# Patient Record
Sex: Male | Born: 2000 | Race: White | Hispanic: No | Marital: Single | Smoking: Current every day smoker
Health system: Southern US, Community
[De-identification: ages and names within clinical notes are randomized; demographics above are authoritative.]

## PROBLEM LIST (undated history)

## (undated) DIAGNOSIS — F329 Major depressive disorder, single episode, unspecified: Secondary | ICD-10-CM

## (undated) DIAGNOSIS — T7840XA Allergy, unspecified, initial encounter: Secondary | ICD-10-CM

## (undated) DIAGNOSIS — E785 Hyperlipidemia, unspecified: Secondary | ICD-10-CM

## (undated) DIAGNOSIS — J45909 Unspecified asthma, uncomplicated: Secondary | ICD-10-CM

## (undated) DIAGNOSIS — Z22322 Carrier or suspected carrier of Methicillin resistant Staphylococcus aureus: Secondary | ICD-10-CM

## (undated) DIAGNOSIS — Z7289 Other problems related to lifestyle: Secondary | ICD-10-CM

## (undated) DIAGNOSIS — I10 Essential (primary) hypertension: Secondary | ICD-10-CM

## (undated) DIAGNOSIS — K219 Gastro-esophageal reflux disease without esophagitis: Secondary | ICD-10-CM

## (undated) DIAGNOSIS — F411 Generalized anxiety disorder: Secondary | ICD-10-CM

## (undated) HISTORY — DX: Hyperlipidemia, unspecified: E78.5

## (undated) HISTORY — PX: TONSILLECTOMY: SUR1361

## (undated) HISTORY — DX: Gastro-esophageal reflux disease without esophagitis: K21.9

## (undated) HISTORY — DX: Essential (primary) hypertension: I10

## (undated) HISTORY — DX: Allergy, unspecified, initial encounter: T78.40XA

## (undated) HISTORY — DX: Major depressive disorder, single episode, unspecified: F32.9

## (undated) HISTORY — DX: Unspecified asthma, uncomplicated: J45.909

## (undated) HISTORY — DX: Generalized anxiety disorder: F41.1

## (undated) HISTORY — DX: Carrier or suspected carrier of methicillin resistant Staphylococcus aureus: Z22.322

---
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
## 2020-01-07 ENCOUNTER — Encounter: Payer: Self-pay | Admitting: Emergency Medicine

## 2020-01-07 ENCOUNTER — Emergency Department
Admission: EM | Admit: 2020-01-07 | Discharge: 2020-01-07 | Disposition: A | Discharge status: Home / Self Care | Payer: BC Managed Care – PPO | Source: Home / Self Care

## 2020-01-07 ENCOUNTER — Other Ambulatory Visit: Payer: Self-pay

## 2020-01-07 DIAGNOSIS — S39012A Strain of muscle, fascia and tendon of lower back, initial encounter: Secondary | ICD-10-CM | POA: Diagnosis not present

## 2020-01-07 DIAGNOSIS — M62830 Muscle spasm of back: Secondary | ICD-10-CM | POA: Diagnosis not present

## 2020-01-07 MED ORDER — METHOCARBAMOL 500 MG PO TABS: 500.0000 mg | ORAL_TABLET | Freq: Two times a day (BID) | ORAL | 0 refills | Status: DC

## 2020-01-07 MED ORDER — PREDNISONE 10 MG PO TABS: 40.0000 mg | ORAL_TABLET | Freq: Every day | ORAL | 0 refills | Status: AC

## 2020-01-07 NOTE — ED Provider Notes (Signed)
[name]    CSN: [id number] Arrival date & time: 01/07/20  1619      History   Chief Complaint Chief Complaint  Patient presents with  . Back Pain    HPI Kyle Fitzpatrick is a 19 y.o. male.   19 year old male, with history of asthma, dyslipidemia, hypertension, GERD, generalized anxiety, presenting today complaining of back pain.  Patient is complaining of left-sided lumbar back pain/muscle spasms over the past 3 days.  States that he does heavy lifting at work and has had pain since that time.  No midline pain.  States the pain radiates into his left posterior thigh.  No loss of bowel or bladder function, saddle anesthesia, numbness or weakness in lower extremities.  States that he has been taking 800 mg ibuprofen twice daily without much improvement.  The history is provided by the patient.  Back Pain Location:  Lumbar spine Quality:  Aching Radiates to:  L posterior upper leg Pain severity:  Moderate Pain is:  Same all the time Onset quality:  Gradual Duration:  3 days Timing:  Constant Progression:  Unchanged Chronicity:  New Context: lifting heavy objects and occupational injury   Context: not emotional stress, not falling, not jumping from heights, not MCA, not MVA, not pedestrian accident, not physical stress, not recent illness, not recent injury and not twisting   Relieved by:  Nothing Worsened by:  Bending and twisting Ineffective treatments:  NSAIDs Associated symptoms: leg pain   Associated symptoms: no abdominal pain, no abdominal swelling, no bladder incontinence, no bowel incontinence, no chest pain, no dysuria, no fever, no headaches, no numbness, no paresthesias, no pelvic pain, no perianal numbness, no tingling, no weakness and no weight loss   Risk factors: no hx of cancer, no hx of osteoporosis, no lack of exercise, no menopause, not obese, not pregnant, no recent surgery, no steroid use and no vascular disease     Past Medical History:    Diagnosis Date  . Allergy   . Asthma    no hospitalizations; no ED visits.    . Deliberate self-cutting   . Dyslipidemia 02/16/2018  . Essential hypertension 02/15/2018  . GAD (generalized anxiety disorder) 02/01/2018  . GERD (gastroesophageal reflux disease) 02/15/2018  . MDD (major depressive disorder) 02/01/2018  . 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 Dyslipidemia 02/16/2018  . Essential hypertension 02/15/2018  . Class 1 obesity due to excess calories without serious comorbidity with body mass index (BMI) of 33.0 to 33.9 in adult 02/15/2018  . GERD (gastroesophageal reflux disease) 02/15/2018  . MDD (major depressive disorder) 02/01/2018  . Anxiety disorder 02/01/2018  . Headache 09/18/2017  . Inattention 06/12/2017  . Insomnia 04/28/2017  . Back pain 01/19/2017  . 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 Asthma 08/11/2012  . AR (allergic rhinitis) 08/11/2012    Past Surgical History:  Procedure Laterality Date  . TONSILLECTOMY  2008  . TONSILLECTOMY         Home Medications    Prior to Admission medications   Medication Sig Start Date End Date Taking? Authorizing Provider  albuterol (PROVENTIL HFA;VENTOLIN HFA) 108 (90 Base) MCG/ACT inhaler 2 puff prior to exercise or every 6 hours with wheezing 01/19/17   Gregor Hams, MD  amitriptyline (ELAVIL) 25 MG tablet TAKE 1-2 TABELTS BY MOUTH AS BEDTIME AS NEEDED FOR SLEEP 09/17/19   Gregor Hams, MD  buPROPion (WELLBUTRIN XL) 150 MG 24 hr tablet Take  one each day 03/11/19   Gentry Fitz, MD  busPIRone (BUSPAR) 10 MG tablet Take two twice/day 03/11/19   Gentry Fitz, MD  cetirizine (ZYRTEC) 10 MG tablet Take 10 mg by mouth daily.    [provider]  FLUoxetine (PROZAC) 40 MG capsule Take 1 capsule (40 mg total) by mouth daily. 03/11/19 06/09/19  Gentry Fitz, MD  losartan (COZAAR) 50 MG tablet Take 1 tablet (50 mg total) by mouth daily. 03/29/18   Rodolph Bong, MD  methocarbamol  (ROBAXIN) 500 MG tablet Take 1 tablet (500 mg total) by mouth 2 (two) times daily. 01/07/20   Aristide Waggle C, PA-C  omeprazole (PRILOSEC) 40 MG capsule Take 1 capsule (40 mg total) by mouth daily. 02/15/18   Rodolph Bong, MD  predniSONE (DELTASONE) 10 MG tablet Take 4 tablets (40 mg total) by mouth daily for 5 days. 01/07/20 01/12/20  Kendra Woolford C, PA-C  triamcinolone cream (KENALOG) 0.5 % Apply 1 application topically 2 (two) times daily. To affected areas. 05/30/19   Rodolph Bong, MD    Family History Family History  Problem Relation Age of Onset  . Heart disease Maternal Uncle        3 MIs by 45, pacemaker, greenfield filter  . Hyperlipidemia Maternal Uncle   . Alcohol abuse Father   . Hypertension Father   . Migraines Mother   . Heart disease Maternal Grandfather   . Hyperlipidemia Maternal Grandfather   . Cancer Maternal Grandfather        pancreatic    Social History Social History   Tobacco Use  . Smoking status: Never Smoker  . Smokeless tobacco: Never Used  Substance Use Topics  . Alcohol use: No  . Drug use: No     Allergies   Bactrim [sulfamethoxazole-trimethoprim]   Review of Systems Review of Systems  Constitutional: Negative for chills, fever and weight loss.  HENT: Negative for ear pain and sore throat.   Eyes: Negative for pain and visual disturbance.  Respiratory: Negative for cough and shortness of breath.   Cardiovascular: Negative for chest pain and palpitations.  Gastrointestinal: Negative for abdominal pain, bowel incontinence and vomiting.  Genitourinary: Negative for bladder incontinence, dysuria, hematuria and pelvic pain.  Musculoskeletal: Positive for back pain. Negative for arthralgias.  Skin: Negative for color change and rash.  Neurological: Negative for tingling, seizures, syncope, weakness, numbness, headaches and paresthesias.  All other systems reviewed and are negative.    Physical Exam Triage Vital Signs ED Triage Vitals  [01/07/20 1652]  Enc Vitals Group     BP 116/66     Pulse Rate (!) 58     Resp 16     Temp 98.2 F (36.8 C)     Temp Source Oral     SpO2 97 %     Weight      Height      Head Circumference      Peak Flow      Pain Score      Pain Loc      Pain Edu?      Excl. in GC?    No data found.  Updated Vital Signs BP 116/66 (BP Location: Right Arm)   Pulse (!) 58   Temp 98.2 F (36.8 C) (Oral)   Resp 16   Ht 5\' 8"  (1.727 m)   Wt 175 lb (79.4 kg)   SpO2 97%   BMI 26.61 kg/m   Visual Acuity Right Eye  Distance:   Left Eye Distance:   Bilateral Distance:    Right Eye Near:   Left Eye Near:    Bilateral Near:     Physical Exam Vitals and nursing note reviewed.  Constitutional:      Appearance: He is well-developed.  HENT:     Head: Normocephalic and atraumatic.  Eyes:     Conjunctiva/sclera: Conjunctivae normal.  Cardiovascular:     Rate and Rhythm: Normal rate and regular rhythm.     Heart sounds: No murmur.  Pulmonary:     Effort: Pulmonary effort is normal. No respiratory distress.     Breath sounds: Normal breath sounds.  Abdominal:     Palpations: Abdomen is soft.     Tenderness: There is no abdominal tenderness.  Musculoskeletal:     Cervical back: Neck supple.     Lumbar back: Spasms and tenderness present.       Back:     Comments: Tenderness with spasm noted to the left-sided lumbar musculature.  There is no midline tenderness palpation.  Skin:    General: Skin is warm and dry.  Neurological:     Mental Status: He is alert.     GCS: GCS eye subscore is 4. GCS verbal subscore is 5. GCS motor subscore is 6.     Cranial Nerves: Cranial nerves are intact.     Sensory: Sensation is intact.     Motor: Motor function is intact.     Coordination: Coordination is intact.     Gait: Gait is intact.     Deep Tendon Reflexes: Reflexes are normal and symmetric.      UC Treatments / Results  Labs (all labs ordered are listed, but only abnormal results are  displayed) Labs Reviewed - No data to display  EKG   Radiology No results found.  Procedures Procedures (including critical care time)  Medications Ordered in UC Medications - No data to display  Initial Impression / Assessment and Plan / UC Course  I have reviewed the triage vital signs and the nursing notes.  Pertinent labs & imaging results that were available during my care of the patient were reviewed by me and considered in my medical decision making (see chart for details).     Lumbar back pain with muscle spasm.  No red flag symptoms.  No midline tenderness.  Will treat with steroids and muscle relaxants. Final Clinical Impressions(s) / UC Diagnoses   Final diagnoses:  Strain of lumbar region, initial encounter  Muscle spasm of back   Discharge Instructions   None    ED Prescriptions    Medication Sig Dispense Auth. Provider   predniSONE (DELTASONE) 10 MG tablet Take 4 tablets (40 mg total) by mouth daily for 5 days. 20 tablet Langdon Crosson C, PA-C   methocarbamol (ROBAXIN) 500 MG tablet Take 1 tablet (500 mg total) by mouth 2 (two) times daily. 20 tablet Jerman Tinnon C, PA-C     PDMP not reviewed this encounter.   Alecia Lemming, [place] 01/07/20 1659

## 2020-01-07 NOTE — ED Triage Notes (Signed)
Patient states back pain started 3 days ago with intermittent spasms; no known injury or unusual motion. Has not had influenza vacc this season. No known covid positive person exposure.

## 2020-02-11 ENCOUNTER — Other Ambulatory Visit: Payer: Self-pay

## 2020-02-11 ENCOUNTER — Emergency Department (HOSPITAL_BASED_OUTPATIENT_CLINIC_OR_DEPARTMENT_OTHER)
Admission: EM | Admit: 2020-02-11 | Discharge: 2020-02-11 | Disposition: A | Discharge status: Home / Self Care | Payer: BC Managed Care – PPO | Attending: Emergency Medicine | Admitting: Emergency Medicine

## 2020-02-11 ENCOUNTER — Encounter (HOSPITAL_BASED_OUTPATIENT_CLINIC_OR_DEPARTMENT_OTHER): Payer: Self-pay | Admitting: Emergency Medicine

## 2020-02-11 ENCOUNTER — Emergency Department (HOSPITAL_BASED_OUTPATIENT_CLINIC_OR_DEPARTMENT_OTHER): Payer: BC Managed Care – PPO

## 2020-02-11 DIAGNOSIS — I10 Essential (primary) hypertension: Secondary | ICD-10-CM | POA: Diagnosis not present

## 2020-02-11 DIAGNOSIS — E782 Mixed hyperlipidemia: Secondary | ICD-10-CM | POA: Diagnosis not present

## 2020-02-11 DIAGNOSIS — J45909 Unspecified asthma, uncomplicated: Secondary | ICD-10-CM | POA: Diagnosis not present

## 2020-02-11 DIAGNOSIS — R011 Cardiac murmur, unspecified: Secondary | ICD-10-CM | POA: Insufficient documentation

## 2020-02-11 DIAGNOSIS — Z882 Allergy status to sulfonamides status: Secondary | ICD-10-CM | POA: Diagnosis not present

## 2020-02-11 DIAGNOSIS — Z79899 Other long term (current) drug therapy: Secondary | ICD-10-CM | POA: Diagnosis not present

## 2020-02-11 DIAGNOSIS — R55 Syncope and collapse: Secondary | ICD-10-CM

## 2020-02-11 LAB — CBG MONITORING, ED: Glucose-Capillary: 105 mg/dL — ABNORMAL HIGH (ref 70–99)

## 2020-02-11 NOTE — ED Triage Notes (Signed)
Patient presents with complaints of syncopal episode approx 1 hour pta. States once awake he was alert and oriented; denies any pain; states etoh use tonight.

## 2020-02-11 NOTE — ED Provider Notes (Signed)
Emergency Department Provider Note   I have reviewed the triage vital signs and the nursing notes.   HISTORY  Chief Complaint Loss of Consciousness   HPI Kyle Fitzpatrick is a 19 y.o. male who presents for syncope.  Patient states he had a few drinks tonight and was feeling okay not intoxicated he stood up to go to bed when he got to the bed he had an episode where he lost consciousness.  No shaking.  No loss of bowel or bladder.  No tongue biting.  He states that this lasted for couple minutes and his girlfriend stated he would pass out for 20 to 30 seconds at a time but then be away for 20 to 30 seconds.  Patient states he has a history of passing out with exertion like it football practice.  He thought it was just being from dehydrated.  Does not pass out with exertion anymore.  He usually stays pretty well-hydrated drinking 1 to 2 gallons of water a day along with Gatorade.   States however that he has not worked last couple days so has not been drink as much fluid as he probably should have.  He did eat a normal amount today.  No other associated or modifying symptoms.    Past Medical History:  Diagnosis Date  . Allergy   . Asthma    no hospitalizations; no ED visits.    . Deliberate self-cutting   . Dyslipidemia 02/16/2018  . Essential hypertension 02/15/2018  . GAD (generalized anxiety disorder) 02/01/2018  . GERD (gastroesophageal reflux disease) 02/15/2018  . MDD (major depressive disorder) 02/01/2018  . 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 Dyslipidemia 02/16/2018  . Essential hypertension 02/15/2018  . Class 1 obesity due to excess calories without serious comorbidity with body mass index (BMI) of 33.0 to 33.9 in adult 02/15/2018  . GERD (gastroesophageal reflux disease) 02/15/2018  . MDD (major depressive disorder) 02/01/2018  . Anxiety disorder 02/01/2018  . Headache 09/18/2017  . Inattention 06/12/2017  . Insomnia  04/28/2017  . Back pain 01/19/2017  . 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 Asthma 08/11/2012  . AR (allergic rhinitis) 08/11/2012    Past Surgical History:  Procedure Laterality Date  . TONSILLECTOMY  2008  . TONSILLECTOMY      Current Outpatient Rx  . Order #: [id number] Class: Historical Med  . Order #: [id number] Class: Normal  . Order #: [id number] Class: Normal  . Order #: [id number] Class: Normal  . Order #: [id number] Class: Normal  . Order #: [id number] Class: Historical Med  . Order #: [id number] Class: Normal  . Order #: [id number] Class: Normal  . Order #: [id number] Class: Normal  . Order #: [id number] Class: Normal  . Order #: [id number] Class: Normal    Allergies Bactrim [sulfamethoxazole-trimethoprim]  Family History  Problem Relation Age of Onset  . Heart disease Maternal Uncle        3 MIs by 14, pacemaker, greenfield filter  . Hyperlipidemia Maternal Uncle   . Alcohol abuse Father   . Hypertension Father   . Migraines Mother   . Heart disease Maternal Grandfather   . Hyperlipidemia Maternal Grandfather   . Cancer Maternal Grandfather        pancreatic    Social History Social History   Tobacco Use  . Smoking status: Never Smoker  . Smokeless tobacco: Never Used  Substance Use Topics  . Alcohol use: Yes  . Drug use: No    Review of Systems  All other systems negative except as documented in the HPI. All pertinent positives and negatives as reviewed in the HPI. ____________________________________________   PHYSICAL EXAM:  VITAL SIGNS: ED Triage Vitals  Enc Vitals Group     BP 02/11/20 0237 132/70     Pulse Rate 02/11/20 0237 69     Resp 02/11/20 0237 18     Temp 02/11/20 0237 98.2 F (36.8 C)     Temp Source 02/11/20 0237 Oral     SpO2 02/11/20 0237 100 %     Weight 02/11/20 0235 174 lb 2.6 oz (79 kg)     Height 02/11/20 0235 5\' 9"  (1.753 m)    Constitutional: Alert and oriented. Well appearing and in no acute distress. Eyes: Conjunctivae are  normal. PERRL. EOMI. Head: Atraumatic. Nose: No congestion/rhinnorhea. Mouth/Throat: Mucous membranes are moist.  Oropharynx non-erythematous. Neck: No stridor.  No meningeal signs.   Cardiovascular: Normal rate, regular rhythm. Good peripheral circulation.  2/6 systolic heart murmur. Respiratory: Normal respiratory effort.  No retractions. Lungs CTAB. Gastrointestinal: Soft and nontender. No distention.  Musculoskeletal: No lower extremity tenderness nor edema. No gross deformities of extremities. Neurologic:  Normal speech and language. No gross focal neurologic deficits are appreciated.  Skin:  Skin is warm, dry and intact. No rash noted.  ____________________________________________   LABS (all labs ordered are listed, but only abnormal results are displayed)  Labs Reviewed  CBG MONITORING, ED - Abnormal; Notable for the following components:      Result Value   Glucose-Capillary 105 (*)    All other components within normal limits   ____________________________________________  EKG   EKG Interpretation  Date/Time:  Tuesday February 11 2020 03:00:41 EST Ventricular Rate:  61 PR Interval:    QRS Duration: 101 QT Interval:  433 QTC Calculation: 437 R Axis:   82 Text Interpretation: Sinus rhythm No significant change since last tracing Confirmed by 01[birth year]1994 [phone]) on 02/11/2020 3:44:27 AM       ____________________________________________  RADIOLOGY  DG Chest Portable 1 View  Result Date: 02/11/2020 CLINICAL DATA:  Syncope EXAM: PORTABLE CHEST 1 VIEW COMPARISON:  03/26/2018 FINDINGS: The heart size and mediastinal contours are within normal limits. Both lungs are clear. The visualized skeletal structures are unremarkable. IMPRESSION: No active disease. Electronically Signed   By: 05/26/2018 M.D.   On: 02/11/2020 03:22    ____________________________________________   PROCEDURES  Procedure(s) performed:    Procedures   ____________________________________________   INITIAL IMPRESSION / ASSESSMENT AND PLAN / ED COURSE  Story certainly sounds like hypovolemic orthostatic syncope.  However with a heart murmur with a known ever mentioned doing before and is past history of exertional syncope deftly needs to see an cardiologist to get an echocardiogram.  Will get EKG, chest x-ray, orthostatics and a CBG here just for screening purposes the ultimate I think he likely will go home as long as asymptomatic but encouraged on multiple occasions the necessity for a echocardiogram and cardiology follow-up  Work-up here without any emergent conditions however still concern for possible structural heart disease.  Discussed not exert himself too much and not driving to see his cardiologist.  Cardiology referral placed.   Pertinent labs & imaging results that were available during my care of the patient were reviewed by me and considered in my medical decision making (see chart for details).  A medical screening exam was performed and I feel the patient has had an appropriate workup for their chief complaint at this time and  likelihood of emergent condition existing is low. They have been counseled on decision, discharge, follow up and which symptoms necessitate immediate return to the emergency department. They or their family verbally stated understanding and agreement with plan and discharged in stable condition.   ____________________________________________  FINAL CLINICAL IMPRESSION(S) / ED DIAGNOSES  Final diagnoses:  Syncope, unspecified syncope type  Cardiac murmur    MEDICATIONS GIVEN DURING THIS VISIT:  Medications - No data to display   NEW OUTPATIENT MEDICATIONS STARTED DURING THIS VISIT:  Discharge Medication List as of 02/11/2020  3:45 AM      Note:  This note was prepared with assistance of Dragon voice recognition software. Occasional wrong-word or sound-a-like substitutions may  have occurred due to the inherent limitations of voice recognition software.   Lakendrick Paradis, Barbara Cower, MD 02/11/20 [phone]

## 2020-04-14 ENCOUNTER — Ambulatory Visit: Payer: BC Managed Care – PPO | Admitting: Family Medicine

## 2021-05-16 ENCOUNTER — Encounter (HOSPITAL_COMMUNITY): Payer: Self-pay | Admitting: Emergency Medicine

## 2021-05-16 ENCOUNTER — Emergency Department (HOSPITAL_COMMUNITY): Payer: BC Managed Care – PPO

## 2021-05-16 ENCOUNTER — Emergency Department (HOSPITAL_COMMUNITY)
Admission: EM | Admit: 2021-05-16 | Discharge: 2021-05-17 | Disposition: A | Discharge status: Home / Self Care | Payer: BC Managed Care – PPO | Attending: Emergency Medicine | Admitting: Emergency Medicine

## 2021-05-16 ENCOUNTER — Other Ambulatory Visit: Payer: Self-pay

## 2021-05-16 DIAGNOSIS — R10816 Epigastric abdominal tenderness: Secondary | ICD-10-CM | POA: Insufficient documentation

## 2021-05-16 DIAGNOSIS — J45909 Unspecified asthma, uncomplicated: Secondary | ICD-10-CM | POA: Insufficient documentation

## 2021-05-16 DIAGNOSIS — I10 Essential (primary) hypertension: Secondary | ICD-10-CM | POA: Insufficient documentation

## 2021-05-16 DIAGNOSIS — K920 Hematemesis: Secondary | ICD-10-CM | POA: Diagnosis not present

## 2021-05-16 DIAGNOSIS — Z79899 Other long term (current) drug therapy: Secondary | ICD-10-CM | POA: Insufficient documentation

## 2021-05-16 DIAGNOSIS — R001 Bradycardia, unspecified: Secondary | ICD-10-CM

## 2021-05-16 MED ORDER — ONDANSETRON HCL 4 MG PO TABS: 4.0000 mg | ORAL_TABLET | Freq: Four times a day (QID) | ORAL | 0 refills | Status: DC

## 2021-05-16 MED ORDER — SODIUM CHLORIDE 0.9 % IV BOLUS
500.0000 mL | Freq: Once | INTRAVENOUS | Status: AC
  Administered 2021-05-16: 500 mL via INTRAVENOUS

## 2021-05-16 MED ORDER — ALUM & MAG HYDROXIDE-SIMETH 200-200-20 MG/5ML PO SUSP
30.0000 mL | Freq: Once | ORAL | Status: AC
  Administered 2021-05-16: 30 mL via ORAL
  Filled 2021-05-16: qty 30

## 2021-05-16 MED ORDER — HALOPERIDOL LACTATE 5 MG/ML IJ SOLN
1.0000 mg | Freq: Once | INTRAMUSCULAR | Status: AC
  Administered 2021-05-16: 1 mg via INTRAMUSCULAR
  Filled 2021-05-16: qty 1

## 2021-05-16 MED ORDER — LIDOCAINE VISCOUS HCL 2 % MT SOLN
15.0000 mL | Freq: Once | OROMUCOSAL | Status: AC
  Administered 2021-05-16: 15 mL via ORAL
  Filled 2021-05-16: qty 15

## 2021-05-16 MED ORDER — ONDANSETRON HCL 4 MG/2ML IJ SOLN
4.0000 mg | Freq: Once | INTRAMUSCULAR | Status: AC
  Administered 2021-05-16: 4 mg via INTRAVENOUS
  Filled 2021-05-16: qty 2

## 2021-05-16 MED ORDER — CAPSAICIN 0.025 % EX CREA
TOPICAL_CREAM | Freq: Once | CUTANEOUS | Status: DC
  Filled 2021-05-16 (×2): qty 60

## 2021-05-16 MED ORDER — PANTOPRAZOLE SODIUM 40 MG IV SOLR
40.0000 mg | Freq: Once | INTRAVENOUS | Status: AC
  Administered 2021-05-16: 40 mg via INTRAVENOUS
  Filled 2021-05-16: qty 40

## 2021-05-16 NOTE — ED Provider Notes (Signed)
[facility] EMERGENCY DEPARTMENT Provider Note   CSN: [id number] Arrival date & time: 05/16/21  2118     History Chief Complaint  Patient presents with  . Hematemesis    Kyle Fitzpatrick is a 20 y.o. male.  HPI    Kyle Fitzpatrick , a 20 y.o. male  was evaluated in triage.  Pt complains of hematemesis. Has been vomiting 1-2x daily for the past week. Had 3 beers today while golfing and vomited streaks of blood afterward. The vomiting has worsened with more blood in the sputum, the second episode prompting his visit tonight. States his abdomen feels like its on fire. Daily marijuana user. 2 alcoholic beverages nightly.H/o gerd, no longer taking medicine. No history UC, Crohns. No fevers, bloody diarrhea. Reports constipation x 1 month. No prior abdominal surgeries.   Past Medical History:  Diagnosis Date  . Allergy   . Asthma    no hospitalizations; no ED visits.    . Deliberate self-cutting   . Dyslipidemia 02/16/2018  . Essential hypertension 02/15/2018  . GAD (generalized anxiety disorder) 02/01/2018  . GERD (gastroesophageal reflux disease) 02/15/2018  . MDD (major depressive disorder) 02/01/2018  . 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 Dyslipidemia 02/16/2018  . Essential hypertension 02/15/2018  . Class 1 obesity due to excess calories without serious comorbidity with body mass index (BMI) of 33.0 to 33.9 in adult 02/15/2018  . GERD (gastroesophageal reflux disease) 02/15/2018  . MDD (major depressive disorder) 02/01/2018  . Anxiety disorder 02/01/2018  . Headache 09/18/2017  . Inattention 06/12/2017  . Insomnia 04/28/2017  . Back pain 01/19/2017  . 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 Asthma 08/11/2012  . AR (allergic rhinitis) 08/11/2012    Past Surgical History:  Procedure Laterality Date  . TONSILLECTOMY  2008  . TONSILLECTOMY         Family History  Problem Relation Age of Onset  . Heart disease Maternal Uncle        3  MIs by 45, pacemaker, greenfield filter  . Hyperlipidemia Maternal Uncle   . Alcohol abuse Father   . Hypertension Father   . Migraines Mother   . Heart disease Maternal Grandfather   . Hyperlipidemia Maternal Grandfather   . Cancer Maternal Grandfather        pancreatic    Social History   Tobacco Use  . Smoking status: Never Smoker  . Smokeless tobacco: Never Used  Vaping Use  . Vaping Use: Never used  Substance Use Topics  . Alcohol use: Yes  . Drug use: No    Home Medications Prior to Admission medications   Medication Sig Start Date End Date Taking? Authorizing Provider  albuterol (PROVENTIL HFA;VENTOLIN HFA) 108 (90 Base) MCG/ACT inhaler 2 puff prior to exercise or every 6 hours with wheezing 01/19/17   Rodolph Bong, MD  amitriptyline (ELAVIL) 25 MG tablet TAKE 1-2 TABELTS BY MOUTH AS BEDTIME AS NEEDED FOR SLEEP 09/17/19   Rodolph Bong, MD  buPROPion (WELLBUTRIN XL) 150 MG 24 hr tablet Take one each day 03/11/19   Gentry Fitz, MD  busPIRone (BUSPAR) 10 MG tablet Take two twice/day 03/11/19   Gentry Fitz, MD  cetirizine (ZYRTEC) 10 MG tablet Take 10 mg by mouth daily.    [provider]  FLUoxetine (PROZAC) 40 MG capsule Take 1 capsule (40 mg total) by mouth daily. 03/11/19 06/09/19  Gentry Fitz, MD  levocetirizine (XYZAL) 5 MG tablet  Take 5 mg by mouth every evening.    [provider]  losartan (COZAAR) 50 MG tablet Take 1 tablet (50 mg total) by mouth daily. 03/29/18   Rodolph Bong, MD  methocarbamol (ROBAXIN) 500 MG tablet Take 1 tablet (500 mg total) by mouth 2 (two) times daily. 01/07/20   Blue, Olivia C, PA-C  omeprazole (PRILOSEC) 40 MG capsule Take 1 capsule (40 mg total) by mouth daily. 02/15/18   Rodolph Bong, MD  triamcinolone cream (KENALOG) 0.5 % Apply 1 application topically 2 (two) times daily. To affected areas. 05/30/19   Rodolph Bong, MD    Allergies    Bactrim [sulfamethoxazole-trimethoprim]  Review of Systems   Review of Systems   Constitutional: Negative for chills and fever.  HENT: Positive for sore throat. Negative for ear pain.   Eyes: Negative for pain and visual disturbance.  Respiratory: Positive for cough. Negative for shortness of breath.   Cardiovascular: Negative for chest pain and palpitations.  Gastrointestinal: Positive for abdominal pain, constipation, nausea and vomiting. Negative for blood in stool and diarrhea.  Genitourinary: Negative for dysuria and hematuria.  Musculoskeletal: Negative for arthralgias and back pain.  Skin: Negative for color change and rash.  Neurological: Negative for seizures and syncope.  All other systems reviewed and are negative.   Physical Exam Updated Vital Signs BP (!) 152/81 (BP Location: Right Arm)   Pulse 91   Temp 98.7 F (37.1 C) (Oral)   Resp 17   Ht 5\' 9"  (1.753 m)   Wt 81.6 kg   SpO2 97%   BMI 26.58 kg/m   Physical Exam Vitals and nursing note reviewed. Exam conducted with a chaperone present.  Constitutional:      General: He is not in acute distress.    Appearance: Normal appearance.  HENT:     Head: Normocephalic and atraumatic.     Mouth/Throat:     Mouth: Mucous membranes are moist.     Pharynx: Posterior oropharyngeal erythema present. No oropharyngeal exudate.  Eyes:     General: No scleral icterus.       Right eye: No discharge.        Left eye: No discharge.     Extraocular Movements: Extraocular movements intact.     Pupils: Pupils are equal, round, and reactive to light.  Cardiovascular:     Rate and Rhythm: Normal rate and regular rhythm.     Pulses: Normal pulses.     Heart sounds: Normal heart sounds. No murmur heard. No friction rub. No gallop.   Pulmonary:     Effort: Pulmonary effort is normal. No respiratory distress.     Breath sounds: Normal breath sounds. No wheezing.     Comments: Lungs CTA bilaterally.  Abdominal:     General: Abdomen is flat. Bowel sounds are normal. There is no distension.     Palpations:  Abdomen is soft.     Tenderness: There is abdominal tenderness.     Comments: Mild tenderness to epigastric area without peritoneal signs. No guarding. No rigidity   Musculoskeletal:        General: Normal range of motion.  Skin:    General: Skin is warm and dry.     Coloration: Skin is not jaundiced.  Neurological:     Mental Status: He is alert. Mental status is at baseline.     Coordination: Coordination normal.  Psychiatric:        Mood and Affect: Mood normal.  ED Results / Procedures / Treatments   Labs (all labs ordered are listed, but only abnormal results are displayed) Labs Reviewed - No data to display  EKG None  Radiology No results found.  Procedures Procedures   Medications Ordered in ED Medications - No data to display  ED Course  I have reviewed the triage vital signs and the nursing notes.  Pertinent labs & imaging results that were available during my care of the patient were reviewed by me and considered in my medical decision making (see chart for details).    MDM Rules/Calculators/A&P                          Patient presents for hematemesis. Ddx includes: mallory weiss tear, GERD, cannabis hyperemesis, PNA, pancreatitis, gastric ulcer disease, alcohol induced gastritis. Doubt appendicitis - no pain to RLQ, fever, etc.   Basic labs ordered. EKG and CXR to assess for PNA and electrolyte abnormalities due to dehydration from vomiting.   High suspicion for [employer] tear secondary to [employer]. Alcohol Gastris vs. Cannabis hyperemesis.Will treat those sympotoms with zofran and 500 fluid bolus. Will give capsaicin and haldol for suspected cannabis hyperemesis. Ordered GI cocktail and pantoprazole as well.   CXR without signs of PNA. If other labs return normal, he can be PO challenged and discharged. Return precautions discussed. Advised zofran, prilosec and follow up with PCP. Discussed possible etiologies with the patient. Patient agreeable to  plan.   Patient heart rate fluctuates to based on inspiration/expiration - monitor looks like sinus arrhythmia. No heart block. He is not symptomatic. Not lightheaded, dizzy, or having chest pain. Advised him to follow up with PCP and to consider if he should be referred to a cardiologist.   Patient to be discharge pending labs provided they return normal and he passes his oral challenge. Discussed patient with Dr. Manus Gunning who is taking over care at end of shift.   Discussed HPI, physical exam and plan of care for this patient with attending Eber Hong. The attending physician evaluated this patient as part of a shared visit and agrees with plan of care.  Final Clinical Impression(s) / ED Diagnoses Final diagnoses:  None    Rx / DC Orders ED Discharge Orders    None       Theron Arista, Cordelia Poche 05/16/21 2345    Eber Hong, MD 05/21/21 [phone]

## 2021-05-16 NOTE — Discharge Instructions (Addendum)
You were seen today for vomiting blood. I suspect the frequent vomiting episodes this past week caused a superficial tear to your esophagus called a mallory weiss tear. It will improve on its own with time. You should restart taking omeprazole for the next 4 weeks. Take 40 mg in the morning 30 minutes before your first meal. You may take zofran every 6 hours as needed for nausea and vomiting. Also recommend abstaining from alcohol and marijuana while it is healing. The vomiting episodes could be due to GERD, alcohol gastris (alcohol injury to the stomach), or cannabis hypermesis (vomiting caused by chronic marijuana use). You should follow up with your PCP for further evaluation of the cause of your vomiting episodes.  Also follow-up with a cardiologist regarding your episodes of low heart rate.  You should test, echocardiogram as well as wear a Holter monitor at home. Do not participate in any sports or other exertional activities until you are cleared by the cardiologist. Return to the ED with difficulty breathing, chest pain, dizziness, lightheadedness, passing out or any other concerns.

## 2021-05-16 NOTE — ED Provider Notes (Signed)
Emergency Medicine Provider Triage Evaluation Note  Kyle Fitzpatrick , a 20 y.o. male  was evaluated in triage.  Pt complains of hematemesis. Has been vomiting 1-2x daily for the past week. Had 3 beers today while golfing and vomited streaks of blood. States his abdomen feels like fire. Daily marijuana user. 2 alcoholic beverages nightly. No history UC, Crohns. No fevers, bloody diarrhea. Reports constipation x 1 month.   Review of Systems  Positive: Nausea, vomiting, hematemesis, diffuse abdominal pain Negative: Fever, bloody stool, cough   Physical Exam  BP (!) 152/81 (BP Location: Right Arm)   Pulse 91   Temp 98.7 F (37.1 C) (Oral)   Resp 17   Ht 5\' 9"  (1.753 m)   Wt 81.6 kg   SpO2 97%   BMI 26.58 kg/m  Gen:   Awake, no distress. Burping and holding emesis bag Resp:  Normal effort  MSK:   Moves extremities without difficulty  Other:  Mild epigastric tenderness without peritoneal signs.   Medical Decision Making  Medically screening exam initiated at 9:52 PM.  Appropriate orders placed.  Kyle Fitzpatrick was informed that the remainder of the evaluation will be completed by another provider, this initial triage assessment does not replace that evaluation, and the importance of remaining in the ED until their evaluation is complete.    , PA-C 05/16/21 2156    2157, MD 05/21/21 [phone]

## 2021-05-16 NOTE — ED Notes (Signed)
Pt given H20 for po fluid challenge.

## 2021-05-16 NOTE — ED Triage Notes (Signed)
Pt here due to vomiting blood tonight. Pt also c/o constipation for the past month.

## 2021-05-17 ENCOUNTER — Encounter (HOSPITAL_COMMUNITY): Payer: Self-pay

## 2021-05-17 ENCOUNTER — Emergency Department (HOSPITAL_COMMUNITY): Payer: BC Managed Care – PPO

## 2021-05-17 LAB — BASIC METABOLIC PANEL
Anion gap: 8 (ref 5–15)
BUN: 14 mg/dL (ref 6–20)
CO2: 25 mmol/L (ref 22–32)
Calcium: 9.1 mg/dL (ref 8.9–10.3)
Chloride: 106 mmol/L (ref 98–111)
Creatinine, Ser: 0.98 mg/dL (ref 0.61–1.24)
GFR, Estimated: >60 mL/min (ref >60)
Glucose, Bld: 98 mg/dL (ref 70–99)
Potassium: 3.5 mmol/L (ref 3.5–5.1)
Sodium: 139 mmol/L (ref 135–145)

## 2021-05-17 LAB — HEPATIC FUNCTION PANEL
ALT: 22 U/L (ref 0–44)
AST: 23 U/L (ref 15–41)
Albumin: 4.5 g/dL (ref 3.5–5.0)
Alkaline Phosphatase: 49 U/L (ref 38–126)
Bilirubin, Direct: 0.1 mg/dL (ref 0.0–0.2)
Indirect Bilirubin: 0.7 mg/dL (ref 0.3–0.9)
Total Bilirubin: 0.8 mg/dL (ref 0.3–1.2)
Total Protein: 7.3 g/dL (ref 6.5–8.1)

## 2021-05-17 LAB — CBC WITH DIFFERENTIAL/PLATELET
Abs Immature Granulocytes: 0.02 10*3/uL (ref 0.00–0.07)
Basophils Absolute: 0 10*3/uL (ref 0.0–0.1)
Basophils Relative: 1 %
Eosinophils Absolute: 0.1 10*3/uL (ref 0.0–0.5)
Eosinophils Relative: 1 %
HCT: 42 % (ref 39.0–52.0)
Hemoglobin: 14.2 g/dL (ref 13.0–17.0)
Immature Granulocytes: 0 %
Lymphocytes Relative: 34 %
Lymphs Abs: 2.1 10*3/uL (ref 0.7–4.0)
MCH: 33 pg (ref 26.0–34.0)
MCHC: 33.8 g/dL (ref 30.0–36.0)
MCV: 97.7 fL (ref 80.0–100.0)
Monocytes Absolute: 0.7 10*3/uL (ref 0.1–1.0)
Monocytes Relative: 10 %
Neutro Abs: 3.4 10*3/uL (ref 1.7–7.7)
Neutrophils Relative %: 54 %
Platelets: 296 10*3/uL (ref 150–400)
RBC: 4.3 MIL/uL (ref 4.22–5.81)
RDW: 12 % (ref 11.5–15.5)
WBC: 6.3 10*3/uL (ref 4.0–10.5)
nRBC: 0 % (ref 0.0–0.2)

## 2021-05-17 LAB — LIPASE, BLOOD: Lipase: 22 U/L (ref 11–51)

## 2021-05-17 NOTE — ED Notes (Signed)
Pt tolerating po fluids without nausea or emesis. 

## 2021-05-17 NOTE — ED Provider Notes (Signed)
Assumed Care from [facility] and Dr. Hyacinth Meeker.  20 year old male with history of marijuana abuse here with blood-streaked emesis today.  Complains of epigastric pain.  No chest pain or shortness of breath.  Labs are reassuring.  LFTs and lipase are normal.  Found to have episodes of bradycardia in the ED that appears to be sinus.  Sinus rhythm in the 40s on EKG here.   Heart rate does elevate up in the 70s and 80s at times when he is awake.  Had a ED visit for syncope in February 2021 and was referred to see cardiology but never did.  He is asymptomatic today and does not have any dizziness or syncope. Discussed with Dr. Santiago Glad of cardiology who recommends outpatient follow-up with a Holter monitor. No prolonged QT or Brugada. No high degree AV block.  Emphasized with the patient that he should not participate in sports or any other exertional activities until he is cleared by the cardiologist with an echocardiogram to rule out any structural heart disease. Discussed that doing so could be life endangering. He expressed understanding. Advised marijuana cessation.   Glynn Octave, MD 05/17/21 [phone]

## 2021-05-26 ENCOUNTER — Other Ambulatory Visit: Payer: Self-pay

## 2021-05-26 ENCOUNTER — Encounter: Payer: Self-pay | Admitting: Medical-Surgical

## 2021-05-26 ENCOUNTER — Ambulatory Visit: Payer: BC Managed Care – PPO | Admitting: Medical-Surgical

## 2021-05-26 ENCOUNTER — Ambulatory Visit: Payer: BC Managed Care – PPO

## 2021-05-26 VITALS — BP 118/65 | HR 49 | Temp 97.7°F | Ht 67.25 in | Wt 172.2 lb

## 2021-05-26 DIAGNOSIS — F321 Major depressive disorder, single episode, moderate: Secondary | ICD-10-CM

## 2021-05-26 DIAGNOSIS — R1013 Epigastric pain: Secondary | ICD-10-CM

## 2021-05-26 DIAGNOSIS — Z7689 Persons encountering health services in other specified circumstances: Secondary | ICD-10-CM

## 2021-05-26 DIAGNOSIS — R1011 Right upper quadrant pain: Secondary | ICD-10-CM | POA: Diagnosis not present

## 2021-05-26 DIAGNOSIS — R001 Bradycardia, unspecified: Secondary | ICD-10-CM

## 2021-05-26 DIAGNOSIS — K219 Gastro-esophageal reflux disease without esophagitis: Secondary | ICD-10-CM | POA: Diagnosis not present

## 2021-05-26 DIAGNOSIS — F411 Generalized anxiety disorder: Secondary | ICD-10-CM

## 2021-05-26 DIAGNOSIS — F5104 Psychophysiologic insomnia: Secondary | ICD-10-CM

## 2021-05-26 DIAGNOSIS — R079 Chest pain, unspecified: Secondary | ICD-10-CM

## 2021-05-26 MED ORDER — BUPROPION HCL ER (XL) 150 MG PO TB24: ORAL_TABLET | ORAL | 2 refills | Status: DC

## 2021-05-26 MED ORDER — PANTOPRAZOLE SODIUM 40 MG PO TBEC: 40.0000 mg | DELAYED_RELEASE_TABLET | Freq: Every day | ORAL | 3 refills | Status: DC

## 2021-05-26 MED ORDER — BUSPIRONE HCL 10 MG PO TABS: ORAL_TABLET | ORAL | 2 refills | Status: DC

## 2021-05-26 MED ORDER — AMITRIPTYLINE HCL 25 MG PO TABS: ORAL_TABLET | ORAL | 0 refills | Status: DC

## 2021-05-26 MED ORDER — FAMOTIDINE 20 MG PO TABS: 20.0000 mg | ORAL_TABLET | Freq: Two times a day (BID) | ORAL | 1 refills | Status: DC

## 2021-05-26 MED ORDER — FLUOXETINE HCL 40 MG PO CAPS: 40.0000 mg | ORAL_CAPSULE | Freq: Every day | ORAL | 2 refills | Status: DC

## 2021-05-26 NOTE — Progress Notes (Unsigned)
Enrolled patient for a 7 day Zio XT monitor to be mailed to patients home.  

## 2021-05-26 NOTE — Progress Notes (Signed)
Subjective:    CC: Establish care, GERD  HPI: Pleasant 20 year old male presenting today to establish care with a new PCP since Dr. Denyse Amass left.  Has not had medical treatment since June 2020 and is not currently taking any of the medications he was previously prescribed.  GERD-he does have a history of significant GERD.  Over the past few days he has had an exacerbation of his symptoms.  He did go to the emergency room on memorial day for severe stomach pain, nausea, and bloody emesis.  He was started back on omeprazole 40 mg daily.  He has been taking this as prescribed, tolerating well without side effects.  He has noticed a small improvement in his symptoms but he still has significant epigastric and right upper quadrant pain as well as burning in the chest and a feeling of chest tightness.  Complains of no good bowel movement in the last couple of weeks but has not taken any over-the-counter medications to help combat constipation.  Denies melena, hematochezia, and increased flatulence.  Chest pain-Notes that he has sharp stabbing pains in different areas of his chest, today more in the right pectoral area.  These pains are random and intermittent.  In the hospital, he was diagnosed with sinus bradycardia and he is very concerned that he may have heart issues since they do run in his family.  Before his recent resurgence of stomach issues, he was exercising on a daily basis.  Denies palpitations, headaches, dizziness, leg swelling, and vision changes.  Mood-endorses high levels of anxiety as well as some depression.  Previously treated with multiple agents including Wellbutrin, fluoxetine, BuSpar, and amitriptyline.  Has not taken these medications since his refills ran out.  Feels that he would be best if he gets restarted on his medications to help manage his symptoms.  I reviewed the past medical history, family history, social history, surgical history, and allergies today and no changes were  needed.  Please see the problem list section below in epic for further details.  Past Medical History: Past Medical History:  Diagnosis Date  . Allergy   . Asthma    no hospitalizations; no ED visits.    . Deliberate self-cutting   . Dyslipidemia 02/16/2018  . Essential hypertension 02/15/2018  . GAD (generalized anxiety disorder) 02/01/2018  . GERD (gastroesophageal reflux disease) 02/15/2018  . MDD (major depressive disorder) 02/01/2018  . 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 TONSILLECTOMY  2008  . TONSILLECTOMY     Social History: Social History   Socioeconomic History  . Marital status: Single    Spouse name: n/a  . Number of children: 0  . Years of education: Not on file  . Highest education level: Not on file  Occupational History  . Occupation: Consulting civil engineer  Tobacco Use  . Smoking status: Never Smoker  . Smokeless tobacco: Never Used  Vaping Use  . Vaping Use: Never used  Substance and Sexual Activity  . Alcohol use: Yes  . Drug use: No  . Sexual activity: Never    Birth control/protection: Abstinence  Other Topics Concern  . Not on file  Social History Narrative   Lives both parents in the same house.  1 sister, 2 half-brothers, adopted sister (mother's niece). Also, maternal uncle (he has a heart condition, 3 MIs by age 53, a pacemaker, pancreatitis) and paternal aunt are currently living with them.      Education: 6th grader  currently; ABs; favorite subject Band trumpet.  Not sure of career.  No held back or held back; in advanced classes; no behavior issues; no concentration issues.  Plays baseball for fun; plays trumpet.  Television watching 3-4 hours. Punishment:  Sent to room; rarely gets punished.  Cell phone; at night, next to alarm; some nighttime texting.  Bedtime 10:00pm; wakes up at 6:00am.   Sports: baseball year round.     Seatbelt: 100%   Nutrition: skip breakfast; no snack; lunch:   Sandwich, chips, fruit roll up, water, rice crispy.  Snack:  Chips.  Supper:  Brett Albino, coke.  Snack:  None.  Vege:  Potatoes, green beans.   Fruit: watermelon  Favorite food: steak.      Lives with mother, father, and 25 year old sister currently.  Attends 11th grade at [employer].  No recent sick contacts, travel, rashes, or tick exposure.  Development is normal per mother's report.  Patient reports history of self cutting, which he has not made his mother aware of at this time, most recently 2 weeks ago.  He says he does this "to feel something".  Has recently missed 3 weeks of school due to his depression and is trying to currently catch up.  Patient says that things are going okay in school and he has a couple of good friends that he can confide in.  Patient has been weight training for the past 2 years and nothing has changed with his regimen.   Social Determinants of Health   Financial Resource Strain: Not on file  Food Insecurity: Not on file  Transportation Needs: Not on file  Physical Activity: Not on file  Stress: Not on file  Social Connections: Not on file   Family History: Family History  Problem Relation Age of Onset  . Heart disease Maternal Uncle        3 MIs by 45, pacemaker, greenfield filter  . Hyperlipidemia Maternal Uncle   . Alcohol abuse Father   . Hypertension Father   . Migraines Mother   . Heart disease Maternal Grandfather   . Hyperlipidemia Maternal Grandfather   . Cancer Maternal Grandfather        pancreatic   Allergies: Allergies  Allergen Reactions  . Bactrim [Sulfamethoxazole-Trimethoprim] Rash   Medications: See med rec.  Review of Systems: See HPI for pertinent positives and negatives.   Objective:    General: Well Developed, well nourished, and in no acute distress.  Neuro: Alert and oriented x3.  HEENT: Normocephalic, atraumatic.  Skin: Warm and dry. Cardiac: Regular rate and rhythm, no murmurs rubs or gallops, no lower  extremity edema.  Respiratory: Clear to auscultation bilaterally. Not using accessory muscles, speaking in full sentences. Abdomen: Soft, diffusely tender with exquisite tenderness to the epigastric and and right upper quadrant, nondistended.  Positive Murphy sign bowel sounds + x 4 quadrants. No HSM appreciated.  Impression and Recommendations:    1. Encounter to establish care Reviewed available information and discussed healthcare concerns with patient.  2. Gastroesophageal reflux disease without esophagitis 3. Epigastric pain 4. RUQ pain Checking CBC, CMP, amylase, lipase, and TSH today.  Discontinue omeprazole.  Start Protonix 40 mg daily as well as famotidine 20 mg twice daily.  Recent CT abdomen pelvis did not show any gallbladder etiology or hiatal hernia. Referring to GI for further investigation.  Will likely need endoscopy with biopsies. - CBC - COMPLETE METABOLIC PANEL WITH GFR - TSH - Ambulatory referral to Gastroenterology - Amylase -  Lipase  5. Sinus bradycardia 6. Chest pain, unspecified type Checking labs today.  Bradycardia likely related to frequent exercise and athletic pursuits.  Zio patch ordered for long-term monitoring. - CBC - COMPLETE METABOLIC PANEL WITH GFR - TSH - LONG TERM MONITOR (3-14 DAYS); Future  7. Current moderate episode of major depressive disorder without prior episode (HCC) 8. Generalized anxiety disorder Restart fluoxetine 40 mg daily, BuSpar 10 mg twice daily, and Wellbutrin 150 mg daily.  9. Psychophysiological insomnia Restart amitriptyline 25 mg nightly as needed for sleep.  Return in about 4 weeks (around 06/23/2021) for mood follow up. ___________________________________________ Thayer Ohm, DNP, APRN, FNP-BC Primary Care and Sports Medicine [facility] [place]

## 2021-05-27 LAB — CBC
HCT: 46.3 % (ref 38.5–50.0)
Hemoglobin: 16.2 g/dL (ref 13.2–17.1)
MCH: 33.1 pg — ABNORMAL HIGH (ref 27.0–33.0)
MCHC: 35 g/dL (ref 32.0–36.0)
MCV: 94.7 fL (ref 80.0–100.0)
MPV: 9 fL (ref 7.5–12.5)
Platelets: 355 10*3/uL (ref 140–400)
RBC: 4.89 10*6/uL (ref 4.20–5.80)
RDW: 11.8 % (ref 11.0–15.0)
WBC: 5.6 10*3/uL (ref 3.8–10.8)

## 2021-05-27 LAB — COMPLETE METABOLIC PANEL WITH GFR
AG Ratio: 2 (calc) (ref 1.0–2.5)
ALT: 18 U/L (ref 9–46)
AST: 16 U/L (ref 10–40)
Albumin: 5.1 g/dL (ref 3.6–5.1)
Alkaline phosphatase (APISO): 56 U/L (ref 36–130)
BUN: 14 mg/dL (ref 7–25)
CO2: 24 mmol/L (ref 20–32)
Calcium: 10.4 mg/dL — ABNORMAL HIGH (ref 8.6–10.3)
Chloride: 105 mmol/L (ref 98–110)
Creat: 1.23 mg/dL (ref 0.60–1.35)
GFR, Est African American: 97 mL/min/{1.73_m2} (ref >=60)
GFR, Est Non African American: 84 mL/min/{1.73_m2} (ref >=60)
Globulin: 2.6 g/dL (calc) (ref 1.9–3.7)
Glucose, Bld: 102 mg/dL — ABNORMAL HIGH (ref 65–99)
Potassium: 3.8 mmol/L (ref 3.5–5.3)
Sodium: 139 mmol/L (ref 135–146)
Total Bilirubin: 1 mg/dL (ref 0.2–1.2)
Total Protein: 7.7 g/dL (ref 6.1–8.1)

## 2021-05-27 LAB — TSH: TSH: 1.43 mIU/L (ref 0.40–4.50)

## 2021-05-27 LAB — AMYLASE: Amylase: 37 U/L (ref 21–101)

## 2021-05-27 LAB — LIPASE: Lipase: 11 U/L (ref 7–60)

## 2021-06-08 ENCOUNTER — Ambulatory Visit: Payer: BC Managed Care – PPO | Admitting: Cardiology

## 2021-06-19 ENCOUNTER — Other Ambulatory Visit: Payer: Self-pay | Admitting: Medical-Surgical

## 2021-06-23 ENCOUNTER — Ambulatory Visit (INDEPENDENT_AMBULATORY_CARE_PROVIDER_SITE_OTHER): Payer: BC Managed Care – PPO | Admitting: Medical-Surgical

## 2021-06-23 DIAGNOSIS — Z5329 Procedure and treatment not carried out because of patient's decision for other reasons: Secondary | ICD-10-CM

## 2021-06-23 NOTE — Progress Notes (Signed)
   Complete physical exam  Patient: Kyle Fitzpatrick   DOB: 10/08/1999   20 y.o. Male  MRN: [id number]  Subjective:    No chief complaint on file.   Kyle Fitzpatrick is a 20 y.o. male who presents today for a complete physical exam. She reports consuming a {diet types:17450} diet. {types:19826} She generally feels {DESC; WELL/FAIRLY WELL/POORLY:18703}. She reports sleeping {DESC; WELL/FAIRLY WELL/POORLY:18703}. She {does/does not:200015} have additional problems to discuss today.    Most recent fall risk assessment:    06/15/2022   10:42 AM  Fall Risk   Falls in the past year? 0  Number falls in past yr: 0  Injury with Fall? 0  Risk for fall due to : No Fall Risks  Follow up Falls evaluation completed     Most recent depression screenings:    06/15/2022   10:42 AM 05/06/2021   10:46 AM  PHQ 2/9 Scores  PHQ - 2 Score 0 0  PHQ- 9 Score 5     {VISON DENTAL STD PSA (Optional):27386}  {History (Optional):23778}  Patient Care Team: Yaffa Seckman, NP as PCP - General (Nurse Practitioner)   Outpatient Medications Prior to Visit  Medication Sig   fluticasone (FLONASE) 50 MCG/ACT nasal spray Place 2 sprays into both nostrils in the morning and at bedtime. After 7 days, reduce to once daily.   norgestimate-ethinyl estradiol (SPRINTEC 28) 0.25-35 MG-MCG tablet Take 1 tablet by mouth daily.   Nystatin POWD Apply liberally to affected area 2 times per day   spironolactone (ALDACTONE) 100 MG tablet Take 1 tablet (100 mg total) by mouth daily.   No facility-administered medications prior to visit.    ROS        Objective:     There were no vitals taken for this visit. {Vitals History (Optional):23777}  Physical Exam   No results found for any visits on 07/21/22. {Show previous labs (optional):23779}    Assessment & Plan:    Routine Health Maintenance and Physical Exam  Immunization History  Administered Date(s) Administered   DTaP 12/22/1999, 02/17/2000,  04/27/2000, 01/11/2001, 07/27/2004   Hepatitis A 05/23/2008, 05/29/2009   Hepatitis B 10/09/1999, 11/16/1999, 04/27/2000   HiB (PRP-OMP) 12/22/1999, 02/17/2000, 04/27/2000, 01/11/2001   IPV 12/22/1999, 02/17/2000, 10/16/2000, 07/27/2004   Influenza,inj,Quad PF,6+ Mos 08/29/2014   Influenza-Unspecified 11/28/2012   MMR 10/16/2001, 07/27/2004   Meningococcal Polysaccharide 05/28/2012   Pneumococcal Conjugate-13 01/11/2001   Pneumococcal-Unspecified 04/27/2000, 07/11/2000   Tdap 05/28/2012   Varicella 10/16/2000, 05/23/2008    Health Maintenance  Topic Date Due   HIV Screening  Never done   Hepatitis C Screening  Never done   INFLUENZA VACCINE  07/19/2022   PAP-Cervical Cytology Screening  07/21/2022 (Originally 10/07/2020)   PAP SMEAR-Modifier  07/21/2022 (Originally 10/07/2020)   TETANUS/TDAP  07/21/2022 (Originally 05/28/2022)   HPV VACCINES  Discontinued   COVID-19 Vaccine  Discontinued    Discussed health benefits of physical activity, and encouraged her to engage in regular exercise appropriate for her age and condition.  Problem List Items Addressed This Visit   None Visit Diagnoses     Annual physical exam    -  Primary   Cervical cancer screening       Need for Tdap vaccination          No follow-ups on file.     Valley Ke, NP   

## 2021-06-29 ENCOUNTER — Ambulatory Visit: Payer: BC Managed Care – PPO | Admitting: Nurse Practitioner

## 2021-11-22 ENCOUNTER — Other Ambulatory Visit: Payer: Self-pay

## 2021-11-22 ENCOUNTER — Emergency Department (INDEPENDENT_AMBULATORY_CARE_PROVIDER_SITE_OTHER)
Admission: EM | Admit: 2021-11-22 | Discharge: 2021-11-22 | Disposition: A | Discharge status: Home / Self Care | Payer: BC Managed Care – PPO | Source: Home / Self Care | Attending: Family Medicine | Admitting: Family Medicine

## 2021-11-22 DIAGNOSIS — E785 Hyperlipidemia, unspecified: Secondary | ICD-10-CM

## 2021-11-22 DIAGNOSIS — R42 Dizziness and giddiness: Secondary | ICD-10-CM

## 2021-11-22 DIAGNOSIS — R079 Chest pain, unspecified: Secondary | ICD-10-CM

## 2021-11-22 DIAGNOSIS — R12 Heartburn: Secondary | ICD-10-CM

## 2021-11-22 MED ORDER — OMEPRAZOLE 20 MG PO CPDR: 20.0000 mg | DELAYED_RELEASE_CAPSULE | Freq: Every day | ORAL | 0 refills | Status: DC

## 2021-11-22 NOTE — Discharge Instructions (Addendum)
You need to follow-up with Kyle Fitzpatrick.  Make appointment today You need to follow-up with cardiology You should complete your cardiology testing that was recommended at the first cardiology visit Try to reduce and stop nicotine Moderate alcohol Continue to drink lots of water

## 2021-11-22 NOTE — ED Provider Notes (Signed)
[facility]    CSN: [id number] Arrival date & time: 11/22/21  0848      History   Chief Complaint Chief Complaint  Patient presents with   Chest Pain    Chest pain and dizziness. X2 weeks    HPI Kyle Fitzpatrick is a 20 y.o. male.   HPI  Patient is here with multiple complaints.  He states he started having chest pain off and on for a long period of time.  He states its been worse for the last couple of weeks.  It is random.  It comes and goes.  It is not substernal.  Is not associated with activity.  He has concerned about heart disease because it does tend to run in his family and a young age.  He states he occasionally has shortness of breath but he thinks this may be from asthma. Patient also requests that his GERD medicine be increased or change.  He still has heartburn in spite of being on famotidine twice a day.  He states he is compliant with this medication. Patient also states that he was lightheaded when he woke up this morning.  He states he saw "black spots" in front of his eyes when he got up.  He also had a brief feeling of nausea.  He did not faint.  He sat down and it went away.  He states the whole spell lasted about 30 seconds The patient vapes.  He used to smoke but he still uses nicotine The patient drinks alcohol.  He states he drinks about 3 times a day.  He did drink last night, a total of 3 drinks is what he states Patient works in [employer].  He states he is out of town a lot  Reviewed his medical record.  He saw Kyle Fitzpatrick for similar complaints in June 2022.  He was referred to cardiology.  He kept his initial cardiology visit but he did not get any of the tests requested by the cardiologist.    Past Medical History:  Diagnosis Date   Allergy    Asthma    no hospitalizations; no ED visits.     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    Patient Active Problem List   Diagnosis Date Noted   Dyslipidemia 02/16/2018   Essential hypertension 02/15/2018   Class 1 obesity due to excess calories without serious comorbidity with body mass index (BMI) of 33.0 to 33.9 in adult 02/15/2018   GERD (gastroesophageal reflux disease) 02/15/2018   MDD (major depressive disorder) 02/01/2018   Anxiety disorder 02/01/2018   Headache 09/18/2017   Inattention 06/12/2017   Insomnia 04/28/2017   Back pain 01/19/2017   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 Authorizing Provider  buPROPion (WELLBUTRIN XL) 150 MG 24 hr tablet Take one each day 05/26/21  Yes Kyle Butter, NP  busPIRone (BUSPAR) 10 MG tablet Take two twice/day 05/26/21  Yes Kyle Butter, NP  famotidine (PEPCID) 20 MG tablet Take 1 tablet (20 mg total) by  mouth 2 (two) times daily. 05/26/21  Yes Kyle Butter, NP  omeprazole (PRILOSEC) 20 MG capsule Take 1 capsule (20 mg total) by mouth daily. 11/22/21  Yes Eustace Moore, MD  FLUoxetine (PROZAC) 40 MG capsule Take 1 capsule (40 mg total) by mouth daily. 05/26/21 08/24/21  Kyle Butter, NP    Family History Family History  Problem Relation Age of Onset   Heart disease Maternal Uncle        3 MIs by 59, pacemaker, greenfield filter   Hyperlipidemia Maternal Uncle    Alcohol abuse Father    Hypertension Father    Migraines Mother    Heart disease Maternal Grandfather    Hyperlipidemia Maternal Grandfather    Cancer Maternal Grandfather        pancreatic    Social History Social History   Tobacco Use   Smoking status: Never   Smokeless tobacco: Never  Vaping  Use   Vaping Use: Every day  Substance Use Topics   Alcohol use: Yes   Drug use: No     Allergies   Bactrim [sulfamethoxazole-trimethoprim]   Review of Systems Review of Systems See HPI  Physical Exam Triage Vital Signs ED Triage Vitals  Enc Vitals Group     BP 11/22/21 0858 (!) 141/87     Pulse Rate 11/22/21 0858 61     Resp 11/22/21 0858 20     Temp 11/22/21 0858 98.2 F (36.8 C)     Temp Source 11/22/21 0858 Oral     SpO2 11/22/21 0858 98 %     Weight 11/22/21 0856 175 lb (79.4 kg)     Height 11/22/21 0856 5\' 8"  (1.727 m)     Head Circumference --      Peak Flow --      Pain Score 11/22/21 0856 2     Pain Loc --      Pain Edu? --      Excl. in GC? --    No data found.  Updated Vital Signs BP (!) 141/87 (BP Location: Left Arm)   Pulse 61   Temp 98.2 F (36.8 C) (Oral)   Resp 20   Ht 5\' 8"  (1.727 m)   Wt 79.4 kg   SpO2 98%   BMI 26.61 kg/m     Physical Exam Constitutional:      General: He is not in acute distress.    Appearance: Normal appearance. He is well-developed.  HENT:     Head: Normocephalic and atraumatic.     Right Ear: Tympanic membrane and ear canal normal.     Left Ear: Tympanic membrane and ear canal normal.     Nose: No congestion.     Mouth/Throat:     Pharynx: No posterior oropharyngeal erythema.     Comments: Mask is in place Eyes:     Conjunctiva/sclera: Conjunctivae normal.     Pupils: Pupils are equal, round, and reactive to light.  Cardiovascular:     Rate and Rhythm: Normal rate and regular rhythm.     Heart sounds: Normal heart sounds.  Pulmonary:     Effort: Pulmonary effort is normal. No respiratory distress.     Breath sounds: Normal breath sounds.  Chest:     Chest wall: No tenderness.  Abdominal:     General: Abdomen is flat. There is no distension.     Palpations: Abdomen is soft.     Comments: Liver palpable couple centimeters below the right costal margin.  Mildly tender  Musculoskeletal:        General:  Normal range of motion.     Cervical back: Normal range of motion.  Skin:    General: Skin is warm and dry.  Neurological:     General: No focal deficit present.     Mental Status: He is alert.  Psychiatric:        Mood and Affect: Mood normal.        Behavior: Behavior normal.     UC Treatments / Results  Labs (all labs ordered are listed, but only abnormal results are displayed) Labs Reviewed  LIPID PANEL    EKG   Radiology No results found.  Procedures ED EKG  Date/Time: 11/22/2021 9:45 AM Performed by: Eustace Moore, MD Authorized by: Eustace Moore, MD   ECG reviewed by ED Physician in the absence of a cardiologist: yes   Previous ECG:    Previous ECG:  Compared to current   Similarity:  No change   Comparison ECG info:  May, 2022 Interpretation:    Interpretation: normal     Details:  Sinus arrhythmia Rate:    ECG rate:  68 Rhythm:    Rhythm: sinus rhythm   Ectopy:    Ectopy: none   QRS:    QRS axis:  Normal   QRS intervals:  Normal ST segments:    ST segments:  Normal T waves:    T waves: normal   Q waves:    Abnormal Q-waves: not present   Comments:     Rate increased from 58-68.  Otherwise unchanged (including critical care time)  Medications Ordered in UC Medications - No data to display  Initial Impression / Assessment and Plan / UC Course  I have reviewed the triage vital signs and the nursing notes.  Pertinent labs & imaging results that were available during my care of the patient were reviewed by me and considered in my medical decision making (see chart for details).     Patient's exam is normal.  His EKG is normal.  I do not think he has any acute process going on.  It appears he has had these complaints for a number of months if not years.  He is stable today.  He is encouraged to follow-up with his primary care we will complete the testing that has been recommended for him Final Clinical Impressions(s) / UC Diagnoses    Final diagnoses:  Chest pain, unspecified type  Lightheadedness  Dyslipidemia     Discharge Instructions      You need to follow-up with Kyle Fitzpatrick.  Make appointment today You need to follow-up with cardiology You should complete your cardiology testing that was recommended at the first cardiology visit Try to reduce and stop nicotine Moderate alcohol Continue to drink lots of water    ED Prescriptions     Medication Sig Dispense Auth. Provider   omeprazole (PRILOSEC) 20 MG capsule Take 1 capsule (20 mg total) by mouth daily. 30 capsule Eustace Moore, MD      PDMP not reviewed this encounter.   Eustace Moore, MD 11/22/21 [phone]

## 2021-11-22 NOTE — ED Triage Notes (Signed)
Pt states that he has some chest pain and dizziness. X2 weeks.   Pt states that he isn't vaccinated for covid.  Pt states that he has had flu vaccine.

## 2021-11-23 LAB — LIPID PANEL
Cholesterol: 197 mg/dL (ref <200)
HDL: 78 mg/dL (ref >=40)
LDL Cholesterol (Calc): 101 mg/dL (calc) — ABNORMAL HIGH
Non-HDL Cholesterol (Calc): 119 mg/dL (calc) (ref <130)
Total CHOL/HDL Ratio: 2.5 (calc) (ref <5.0)
Triglycerides: 90 mg/dL (ref <150)

## 2021-12-01 ENCOUNTER — Emergency Department
Admission: RE | Admit: 2021-12-01 | Discharge: 2021-12-01 | Disposition: A | Discharge status: Home / Self Care | Payer: BC Managed Care – PPO | Source: Ambulatory Visit

## 2021-12-01 ENCOUNTER — Other Ambulatory Visit: Payer: Self-pay

## 2021-12-01 VITALS — BP 129/74 | HR 77 | Temp 99.2°F | Resp 20 | Ht 69.0 in | Wt 175.0 lb

## 2021-12-01 DIAGNOSIS — J029 Acute pharyngitis, unspecified: Secondary | ICD-10-CM

## 2021-12-01 MED ORDER — AMOXICILLIN 875 MG PO TABS: 875.0000 mg | ORAL_TABLET | Freq: Two times a day (BID) | ORAL | 0 refills | Status: DC

## 2021-12-01 NOTE — ED Triage Notes (Signed)
Pt states that the right side of his neck is swollen.  Pt states that he thinks it is a swollen lymph node.  X2-3 days

## 2021-12-01 NOTE — ED Provider Notes (Signed)
[facility]    CSN: [id number] Arrival date & time: 12/01/21  1759      History   Chief Complaint Chief Complaint  Patient presents with   Sore Throat    Right swollen lymph node. X2-3 days    HPI Kyle Fitzpatrick is a 20 y.o. male.   HPI  Patient has had a tonsillectomy.  He has a sore throat on the right side of his throat.  He has swollen glands on the right side of his neck.  These are very painful.  Present for 2 to 3 days.  Getting worse.  He has pain with swallowing.  Refuses strep test.  Past Medical History:  Diagnosis Date   Allergy    Asthma    no hospitalizations; no ED visits.     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    Patient Active Problem List   Diagnosis Date Noted   Dyslipidemia 02/16/2018   Essential hypertension 02/15/2018   Class 1 obesity due to excess calories without serious comorbidity with body mass index (BMI) of 33.0 to 33.9 in adult 02/15/2018   GERD (gastroesophageal reflux disease) 02/15/2018   MDD (major depressive disorder) 02/01/2018   Anxiety disorder 02/01/2018   Headache 09/18/2017   Inattention 06/12/2017   Insomnia 04/28/2017   Back pain 01/19/2017   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 Authorizing Provider  amoxicillin (AMOXIL) 875 MG tablet Take 1 tablet (875 mg total) by mouth 2 (two) times daily. 12/01/21  Yes Eustace Moore, MD  buPROPion (WELLBUTRIN XL) 150 MG 24 hr tablet Take one each day 05/26/21  Yes [name], NP  busPIRone (BUSPAR)  10 MG tablet Take two twice/day 05/26/21  Yes [name], NP  famotidine (PEPCID) 20 MG tablet Take 1 tablet (20 mg total) by mouth 2 (two) times daily. 05/26/21  Yes [name], NP  omeprazole (PRILOSEC) 20 MG capsule Take 1 capsule (20 mg total) by mouth daily. 11/22/21  Yes Eustace Moore, MD  FLUoxetine (PROZAC) 40 MG capsule Take 1 capsule (40 mg total) by mouth daily. 05/26/21 08/24/21  [name], NP    Family History Family History  Problem Relation Age of Onset   Heart disease Maternal Uncle        3 MIs by 27, pacemaker, greenfield filter   Hyperlipidemia Maternal Uncle    Alcohol abuse Father    Hypertension Father    Migraines Mother    Heart disease Maternal Grandfather    Hyperlipidemia Maternal Grandfather    Cancer Maternal Grandfather        pancreatic    Social History Social History   Tobacco Use   Smoking status: Never   Smokeless tobacco: Never  Vaping Use   Vaping Use: Every day  Substance Use Topics   Alcohol use: Yes   Drug use: No     Allergies   Bactrim [sulfamethoxazole-trimethoprim]   Review of Systems Review of Systems See HPI  Physical Exam Triage Vital Signs ED Triage Vitals  Enc Vitals Group     BP 12/01/21 1815 129/74     Pulse Rate 12/01/21 1815 77     Resp 12/01/21 1815 20     Temp 12/01/21 1815 99.2 F (37.3 C)     Temp Source 12/01/21 1815 Oral     SpO2 12/01/21 1815 99 %     Weight 12/01/21 1813 175 lb (79.4 kg)     Height 12/01/21 1813 5\' 9"  (1.753 m)     Head Circumference --      Peak Flow --      Pain Score 12/01/21 1813 6     Pain Loc --      Pain Edu? --      Excl. in GC? --    No data found.  Updated Vital Signs BP 129/74 (BP Location: Left Arm)    Pulse 77    Temp 99.2 F (37.3 C) (Oral)    Resp 20    Ht 5\' 9"  (1.753 m)    Wt 79.4 kg    SpO2 99%    BMI 25.84 kg/m      Physical Exam Constitutional:      General: He is not in acute distress.    Appearance: He is well-developed.  HENT:     Head:  Normocephalic and atraumatic.     Right Ear: Tympanic membrane and ear canal normal.     Left Ear: Tympanic membrane and ear canal normal.     Nose: Nose normal. No congestion.     Mouth/Throat:     Mouth: Mucous membranes are moist.     Pharynx: Posterior oropharyngeal erythema present.     Comments: Tonsils surgically absent.  Both tonsillar pillar areas are moderately erythematous.  No swelling.  No exudate Eyes:     Conjunctiva/sclera: Conjunctivae normal.     Pupils: Pupils are equal, round, and reactive to light.  Neck:     Comments: 1 large AC node, 2 to 3 cm at angle of jaw.  Moderately tender.  Mobile Cardiovascular:     Rate and Rhythm: Normal rate.     Heart sounds: Normal heart sounds.  Pulmonary:     Effort: Pulmonary effort is normal. No respiratory distress.     Breath sounds: Normal breath sounds.  Abdominal:     General: There is no distension.     Palpations: Abdomen is soft.  Musculoskeletal:        General: Normal range of motion.     Cervical back: Normal range of motion.  Lymphadenopathy:     Cervical: Cervical adenopathy present.  Skin:    General: Skin is warm and dry.  Neurological:     Mental Status: He is alert.     UC Treatments / Results  Labs (all labs ordered are listed, but only abnormal results are displayed) Labs Reviewed - No data to display  EKG   Radiology No results found.  Procedures Procedures (including critical care time)  Medications Ordered in UC Medications - No data to display  Initial Impression / Assessment and Plan / UC Course  I have reviewed the triage vital signs and the nursing notes.  Pertinent labs & imaging results that were available during my care of the patient were reviewed by me and considered in my medical decision making (see chart for details).     Sore throat.  Lymphadenitis.  Treat with antibiotics Final Clinical Impressions(s) / UC Diagnoses   Final diagnoses:  Sore throat  Discharge Instructions      Take the antibiotic as directed See your doctor if not better by next week   ED Prescriptions     Medication Sig Dispense Auth. Provider   amoxicillin (AMOXIL) 875 MG tablet Take 1 tablet (875 mg total) by mouth 2 (two) times daily. 14 tablet Eustace Moore, MD      PDMP not reviewed this encounter.   Eustace Moore, MD 12/01/21 Barry Brunner

## 2021-12-01 NOTE — Discharge Instructions (Addendum)
Take the antibiotic as directed See your doctor if not better by next week

## 2022-05-03 ENCOUNTER — Encounter: Payer: Self-pay | Admitting: Medical-Surgical

## 2022-05-03 ENCOUNTER — Ambulatory Visit: Payer: BC Managed Care – PPO | Admitting: Medical-Surgical

## 2022-05-03 VITALS — BP 161/83 | HR 69 | Resp 20 | Ht 69.0 in | Wt 184.5 lb

## 2022-05-03 DIAGNOSIS — F419 Anxiety disorder, unspecified: Secondary | ICD-10-CM

## 2022-05-03 DIAGNOSIS — R002 Palpitations: Secondary | ICD-10-CM

## 2022-05-03 DIAGNOSIS — E785 Hyperlipidemia, unspecified: Secondary | ICD-10-CM

## 2022-05-03 DIAGNOSIS — K219 Gastro-esophageal reflux disease without esophagitis: Secondary | ICD-10-CM

## 2022-05-03 DIAGNOSIS — I10 Essential (primary) hypertension: Secondary | ICD-10-CM

## 2022-05-03 MED ORDER — VALSARTAN 40 MG PO TABS: 40.0000 mg | ORAL_TABLET | Freq: Every day | ORAL | 0 refills | Status: DC

## 2022-05-03 MED ORDER — OMEPRAZOLE 20 MG PO CPDR: 20.0000 mg | DELAYED_RELEASE_CAPSULE | Freq: Every day | ORAL | 0 refills | Status: DC

## 2022-05-03 MED ORDER — BUPROPION HCL ER (XL) 150 MG PO TB24
ORAL_TABLET | ORAL | 2 refills | Status: DC
Start: 2022-05-03 — End: 2022-08-29

## 2022-05-03 NOTE — Patient Instructions (Signed)
[phone] call for questions about the heart monitor ?

## 2022-05-03 NOTE — Progress Notes (Signed)
?HPI with pertinent ROS:  ? ?CC: Irregular heartbeat ? ?HPI: ?Pleasant 21 year old male presenting today for the following: ? ?Irregular heartbeat-has had some issues with irregular heartbeat over the last couple of weeks.  This happens intermittently and is not associated with any particular activity.  Has been noted by his significant other as well.  Does not seem to be related to periods of anxiety. ? ?Anxiety-stopped taking all of his medications last year.  Felt that there were a lot of things going on in his life that were more important than his health.  He did not know much of a difference with fluoxetine or BuSpar but did note that using Wellbutrin it made him feel a lot better and helped manage his mood well.  He is interested in restarting Wellbutrin if possible. ? ?Hypertension-not currently taking any medications to manage high blood pressure.  Endorses eating fast food a lot as he works on [employer].  Has a very active job and does occasionally work out outside of that.  Denies chest pain, shortness of breath, headaches, dizziness, vision changes, and lower extremity edema. ? ?GERD-previously taking omeprazole 40 mg daily with good control of his symptoms but has been out of this for a while.  Requesting a refill since it worked very well when he remembers to take it. ? ?I reviewed the past medical history, family history, social history, surgical history, and allergies today and no changes were needed.  Please see the problem list section below in epic for further details. ? ? ?Physical exam:  ? ?General: Well Developed, well nourished, and in no acute distress.  ?Neuro: Alert and oriented x3.  ?HEENT: Normocephalic, atraumatic.  ?Skin: Warm and dry. ?Cardiac: Regular rate and rhythm, no murmurs rubs or gallops, no lower extremity edema.  ?Respiratory: Clear to auscultation bilaterally. Not using accessory muscles, speaking in full sentences. ? ?Impression and Recommendations:   ? ?1.  Essential hypertension ?Blood pressure elevated on arrival and worse on recheck at 161/83.  Restarting antihypertensive therapy with valsartan 40 mg daily.  Checking labs as below.  Recommend checking blood pressure at home with goal of 130/80 or less.  Discussed lifestyle modifications to include regular intentional exercise as well as a low-sodium diet. ?- CBC with Differential/Platelet ?- COMPLETE METABOLIC PANEL WITH GFR ?- Lipid panel ? ?2. Gastroesophageal reflux disease without esophagitis ?Continue omeprazole 40 mg daily as needed. ? ?3. Dyslipidemia ?Checking lipid panel today. ? ?4. Intermittent palpitations ?Checking labs as below.  In office EKG showing sinus bradycardia with marked sinus arrhythmia, rate 56, normal axis, no acute changes.  He does still have a heart monitor that was ordered back in June of last year and wonders if this is still good.  Provided the number for him to call to see if he can still use this monitor for completion of the Zio patch or if we need to enter a new order.  Patient reports he will reach out to them regarding this. ?- CBC with Differential/Platelet ?- COMPLETE METABOLIC PANEL WITH GFR ?- Lipid panel ?- TSH ?- Magnesium ? ?5. Anxiety ?Since he did well with Wellbutrin in the past, sending in Wellbutrin at 150 mg daily.  Advised that this may worsen anxiety in the first 2 weeks however this usually resolves.  Monitor blood pressure closely as this has a chance of raising blood pressure but also has shown potential to lower it in some patients due to lowering their anxiety level. ? ?Return in about 2  weeks (around 05/17/2022) for nurse visit for BP check. ?___________________________________________ ?Thayer Ohm, DNP, APRN, FNP-BC ?Primary Care and Sports Medicine ?[name] ?

## 2022-05-04 LAB — CBC WITH DIFFERENTIAL/PLATELET
Absolute Monocytes: 800 cells/uL (ref 200–950)
Basophils Absolute: 48 cells/uL (ref 0–200)
Basophils Relative: 0.6 %
Eosinophils Absolute: 80 cells/uL (ref 15–500)
Eosinophils Relative: 1 %
HCT: 44.6 % (ref 38.5–50.0)
Hemoglobin: 15.6 g/dL (ref 13.2–17.1)
Lymphs Abs: 2216 cells/uL (ref 850–3900)
MCH: 34.4 pg — ABNORMAL HIGH (ref 27.0–33.0)
MCHC: 35 g/dL (ref 32.0–36.0)
MCV: 98.2 fL (ref 80.0–100.0)
MPV: 8.6 fL (ref 7.5–12.5)
Monocytes Relative: 10 %
Neutro Abs: 4856 cells/uL (ref 1500–7800)
Neutrophils Relative %: 60.7 %
Platelets: 330 10*3/uL (ref 140–400)
RBC: 4.54 10*6/uL (ref 4.20–5.80)
RDW: 11.5 % (ref 11.0–15.0)
Total Lymphocyte: 27.7 %
WBC: 8 10*3/uL (ref 3.8–10.8)

## 2022-05-04 LAB — LIPID PANEL
Cholesterol: 215 mg/dL — ABNORMAL HIGH
HDL: 73 mg/dL
LDL Cholesterol (Calc): 113 mg/dL — ABNORMAL HIGH
Non-HDL Cholesterol (Calc): 142 mg/dL — ABNORMAL HIGH
Total CHOL/HDL Ratio: 2.9 (calc)
Triglycerides: 174 mg/dL — ABNORMAL HIGH

## 2022-05-04 LAB — COMPLETE METABOLIC PANEL WITHOUT GFR
AG Ratio: 2 (calc) (ref 1.0–2.5)
ALT: 21 U/L (ref 9–46)
AST: 23 U/L (ref 10–40)
Albumin: 5.1 g/dL (ref 3.6–5.1)
Alkaline phosphatase (APISO): 50 U/L (ref 36–130)
BUN: 11 mg/dL (ref 7–25)
CO2: 31 mmol/L (ref 20–32)
Calcium: 10.5 mg/dL — ABNORMAL HIGH (ref 8.6–10.3)
Chloride: 101 mmol/L (ref 98–110)
Creat: 1.18 mg/dL (ref 0.60–1.24)
Globulin: 2.5 g/dL (ref 1.9–3.7)
Glucose, Bld: 92 mg/dL (ref 65–99)
Potassium: 4.6 mmol/L (ref 3.5–5.3)
Sodium: 140 mmol/L (ref 135–146)
Total Bilirubin: 0.7 mg/dL (ref 0.2–1.2)
Total Protein: 7.6 g/dL (ref 6.1–8.1)
eGFR: 90 mL/min/1.73m2

## 2022-05-04 LAB — TSH: TSH: 1.29 m[IU]/L (ref 0.40–4.50)

## 2022-05-04 LAB — MAGNESIUM: Magnesium: 2.4 mg/dL (ref 1.5–2.5)

## 2022-05-11 NOTE — Addendum Note (Signed)
Addended by: Chalmers Cater on: 05/11/2022 08:45 AM   Modules accepted: Orders

## 2022-05-14 ENCOUNTER — Ambulatory Visit (HOSPITAL_COMMUNITY)
Admission: EM | Admit: 2022-05-14 | Discharge: 2022-05-15 | Disposition: A | Discharge status: Home / Self Care | Payer: BC Managed Care – PPO

## 2022-05-14 DIAGNOSIS — F331 Major depressive disorder, recurrent, moderate: Secondary | ICD-10-CM

## 2022-05-14 NOTE — Progress Notes (Signed)
   05/14/22 2342  [facility] Triage Screening (Walk-ins at [facility] only)  How Did You Hear About [place]? Self  What Is the Reason for Your Visit/Call Today? Pt presented to [facility] voluntarily. Pt denies reports thoughts of SI and denies planes. Pt denies HI and AVH. Pt states that he lives with his family and has a close relationship with is mother, and sisters who are outside of the home. Pt reports that he has a girlfriend who he states, "saved my life." Pt reports that he recently obtained two DUI's or DWI's and is working on his community services. Pt reports that he takes medications for anxiety and depression.  Pt denies CSW and provider to speak with collateral. Pt reports that he is safe at home but has a estranged relationship with his father. Pt has a full-time job and his own landscape business. Pt self-medicates with alcohol and marijuana. Pt reports that he drinks 2 beers daily and smokes 2 grams of marijuana "that's how I deal with my dad." CSW staffed case with provider and pt is routine with community resources. Pt shares with this CSW that he is fearful that he messed his future up due to the charges. CSW encouraged pt to follow up with outpatient and pt agreed.  How Long Has This Been Causing You Problems? 1-6 months  Have You Recently Had Any Thoughts About Hurting Yourself? Yes  How long ago did you have thoughts about hurting yourself? "Long time, for many years."  Are You Planning to Commit Suicide/Harm Yourself At This time? No  Have you Recently Had Thoughts About Hurting Someone Karolee Ohs? No  Are You Planning To Harm Someone At This Time? No  Are you currently experiencing any auditory, visual or other hallucinations? No  Have You Used Any Alcohol or Drugs in the Past 24 Hours? Yes  How long ago did you use Drugs or Alcohol? today 2 beers and 2 grams of marijuana.  What Did You Use and How Much? 2 beers and 2 grams of marijuana.  Do you have any current medical co-morbidities that require  immediate attention? No  Clinician description of patient physical appearance/behavior: Pt presents under the influence, tearful, disrespectful and distraught.  What Do You Feel Would Help You the Most Today? Alcohol or Drug Use Treatment;Treatment for Depression or other mood problem  If access to [facility] was not available, would you have sought care in the Emergency Department? Yes  Determination of Need Routine (7 days)  Options For Referral Chemical Dependency Intensive Outpatient Therapy (CDIOP);Mobile Crisis;Medication Management;Intensive Outpatient Therapy   Maryjean Ka, MSW, [facility] 05/14/2022 11:45 PM

## 2022-05-14 NOTE — ED Provider Notes (Signed)
Behavioral Health Urgent Care Medical Screening Exam  Patient Name: Kyle Fitzpatrick MRN: [id number] Date of Evaluation: 05/16/22 Chief Complaint:   Diagnosis:  Final diagnoses:  MDD (major depressive disorder), recurrent episode, moderate (HCC)    History of Present illness: Kyle Fitzpatrick is a 21 y.o. male presenting tonight voluntarily with his girlfriend Kyle Fitzpatrick after a an argument with his mother and father at home and that he is the youngest of five children. Patient reports that he gets a long with his mother but he argues with his father a lot. Patient reports that he has been arguing with his father about some construction on their home. Kyle Fitzpatrick reports that he has had two 12 ounce beers tonight and he smokes 2 grams of marijuana daily. Kyle Fitzpatrick reports that he takes Wellbutrin 150 mg prescribed by his PCP Kyle Fitzpatrick, patient denies being in therapy. Patient states that he had 1 DUI and as given community service and now has another DUI that he has to go court for soon. Patient reports that his parents and girlfriend wanted him to have a psychiatric evaluation.   Patient is alert oriented x4, calm and cooperative and does not appear to be responding to any internal or external stimuli at this time. During the assessment the patient is hyperverbal with pressured and rapid speech and labile mood. Patient is able to contract for safety and wants to receive resources for follow up with outpatient resources. Patient does not meet criteria for inpatient treatment and will be given resources for medication management and therapy. Patient denies any SI/HI/AVH.      Psychiatric Specialty Exam  Presentation  General Appearance:Casual  Eye Contact:Good  Speech:Pressured  Speech Volume:Increased  Handedness:No data recorded  Mood and Affect  Mood:Irritable; Angry  Affect:Labile; Tearful   Thought Process  Thought Processes:Coherent  Descriptions of Associations:Intact  Orientation:Full (Time,  Place and Person)  Thought Content:Logical    Hallucinations:None  Ideas of Reference:None  Suicidal Thoughts:Yes, Passive Without Plan; Without Intent  Homicidal Thoughts:No   Sensorium  Memory:Immediate Good; Recent Good; Remote Good  Judgment:Fair  Insight:Fair   Executive Functions  Concentration:Fair  Attention Span:Good  Recall:Good  Fund of Knowledge:No data recorded Language:Good   Psychomotor Activity  Psychomotor Activity:Normal   Assets  Assets:Communication Skills; Financial Resources/Insurance; Housing; Physical Health; Transportation   Sleep  Sleep:Fair  Number of hours: -2   No data recorded  Physical Exam: Physical Exam Constitutional:      Appearance: Normal appearance.  HENT:     Head: Normocephalic and atraumatic.     Nose: Nose normal.  Eyes:     Pupils: Pupils are equal, round, and reactive to light.  Cardiovascular:     Rate and Rhythm: Normal rate.     Pulses: Normal pulses.  Pulmonary:     Effort: Pulmonary effort is normal.  Abdominal:     General: Abdomen is flat.  Musculoskeletal:        General: Normal range of motion.     Cervical back: Normal range of motion.  Skin:    General: Skin is warm.  Neurological:     Mental Status: He is alert and oriented to person, place, and time.  Psychiatric:        Attention and Perception: Attention normal.        Mood and Affect: Mood is anxious. Affect is labile and tearful.        Speech: Speech is rapid and pressured.        Behavior: Behavior is  agitated.        Thought Content: Thought content is not paranoid or delusional. Thought content includes suicidal ideation. Thought content does not include homicidal ideation. Thought content does not include homicidal or suicidal plan.        Cognition and Memory: Cognition normal.   Review of Systems  Constitutional: Negative.   HENT: Negative.    Eyes: Negative.   Respiratory: Negative.    Cardiovascular: Negative.    Gastrointestinal: Negative.   Genitourinary: Negative.   Musculoskeletal: Negative.   Skin: Negative.   Neurological: Negative.   Endo/Heme/Allergies: Negative.   Psychiatric/Behavioral:  Positive for depression and substance abuse. The patient is nervous/anxious.   Blood pressure (!) 153/94, pulse (!) 106, temperature 98.1 F (36.7 C), temperature source Tympanic, resp. rate 18, SpO2 99 %. There is no height or weight on file to calculate BMI.  Musculoskeletal: Strength & Muscle Tone: within normal limits Gait & Station: normal Patient leans: N/A   [facility] MSE Discharge Disposition for Follow up and Recommendations: Based on my evaluation the patient does not appear to have an emergency medical condition and can be discharged with resources and follow up care in outpatient services for Medication Management and Individual Therapy   Jasper Riling, NP 05/16/2022, 12:28 AM

## 2022-05-14 NOTE — ED Notes (Signed)
Pt discharged in no acute distress. A&O x4, ambulatory. Denied SI/HI/AVH. Verbalized understanding of AVS instructions. Belongings returned to pt intact from blue locker. Pt escorted to front lobby by staff. Safety maintained.

## 2022-05-14 NOTE — Discharge Instructions (Addendum)
  Discharge recommendations:  Patient is to take medications as prescribed. Please see information for follow-up appointment with psychiatry and therapy. Please follow up with your primary care provider for all medical related needs.  Please follow up with recommended outpatient medication management and therapy resources  Therapy: We recommend that patient participate in individual therapy to address mental health concerns.  Medications: The parent/guardian is to contact a medical professional and/or outpatient provider to address any new side effects that develop. Parent/guardian should update outpatient providers of any new medications and/or medication changes.    Safety:  The patient should abstain from use of illicit substances/drugs and abuse of any medications. If symptoms worsen or do not continue to improve or if the patient becomes actively suicidal or homicidal then it is recommended that the patient return to the closest hospital emergency department, the [facility], or call 911 for further evaluation and treatment. National Suicide Prevention Lifeline 1-800-SUICIDE or 705-596-5563.  About 988 988 offers 24/7 access to trained crisis counselors who can help people experiencing mental health-related distress. People can call or text 988 or chat 988lifeline.org for themselves or if they are worried about a loved one who may need crisis support.

## 2022-05-14 NOTE — ED Provider Notes (Incomplete)
Behavioral Health Urgent Care Medical Screening Exam  Patient Name: Kyle Fitzpatrick MRN: [id number] Date of Evaluation: 05/14/22 Chief Complaint:   Diagnosis:  Final diagnoses:  None    History of Present illness: Kyle Fitzpatrick is a 21 y.o. male presenting tonight voluntarily with his girlfriend [name] after a an argument with his family at home. Patient reports that he gets a long with his ,other but he argues with his father a lot. Patient reports that he has been arguing with his father about some Holiday representative on his house.   Darryll reports that he has had two 12 ounce beers tonight and he smokes 2 grams of marijuana daily.    Psychiatric Specialty Exam  Presentation  General Appearance:Casual  Eye Contact:Good  Speech:Pressured  Speech Volume:Increased  Handedness:No data recorded  Mood and Affect  Mood:Irritable; Angry  Affect:Labile; Tearful   Thought Process  Thought Processes:Coherent  Descriptions of Associations:Intact  Orientation:Full (Time, Place and Person)  Thought Content:Logical    Hallucinations:None  Ideas of Reference:None  Suicidal Thoughts:Yes, Passive Without Plan; Without Intent  Homicidal Thoughts:No   Sensorium  Memory:Immediate Good; Recent Good; Remote Good  Judgment:Fair  Insight:Fair   Executive Functions  Concentration:Fair  Attention Span:Good  Recall:Good  Fund of Knowledge:No data recorded Language:Good   Psychomotor Activity  Psychomotor Activity:Normal   Assets  Assets:Communication Skills; Financial Resources/Insurance; Housing; Physical Health; Transportation   Sleep  Sleep:Fair  Number of hours: -2   No data recorded  Physical Exam: Physical Exam Constitutional:      Appearance: Normal appearance.  HENT:     Head: Normocephalic and atraumatic.     Nose: Nose normal.  Eyes:     Pupils: Pupils are equal, round, and reactive to light.  Cardiovascular:     Rate and Rhythm: Normal rate.      Pulses: Normal pulses.  Pulmonary:     Effort: Pulmonary effort is normal.  Abdominal:     General: Abdomen is flat.  Musculoskeletal:        General: Normal range of motion.     Cervical back: Normal range of motion.  Skin:    General: Skin is warm.  Neurological:     Mental Status: He is alert and oriented to person, place, and time.  Psychiatric:        Attention and Perception: Attention normal.        Mood and Affect: Mood is anxious. Affect is labile and tearful.        Speech: Speech is rapid and pressured.        Behavior: Behavior is agitated.        Thought Content: Thought content is not paranoid or delusional. Thought content includes suicidal ideation. Thought content does not include homicidal ideation. Thought content does not include homicidal or suicidal plan.        Cognition and Memory: Cognition normal.   Review of Systems  Constitutional: Negative.   HENT: Negative.    Eyes: Negative.   Respiratory: Negative.    Cardiovascular: Negative.   Gastrointestinal: Negative.   Genitourinary: Negative.   Musculoskeletal: Negative.   Skin: Negative.   Neurological: Negative.   Endo/Heme/Allergies: Negative.   Psychiatric/Behavioral:  Positive for depression and substance abuse. The patient is nervous/anxious.   Blood pressure (!) 153/94, pulse (!) 106, temperature 98.1 F (36.7 C), temperature source Tympanic, resp. rate 18, SpO2 99 %. There is no height or weight on file to calculate BMI.  Musculoskeletal: Strength & Muscle Tone: within normal limits  Gait & Station: normal Patient leans: N/A   [facility] MSE Discharge Disposition for Follow up and Recommendations: Based on my evaluation the patient does not appear to have an emergency medical condition and can be discharged with resources and follow up care in outpatient services for Medication Management and Individual Therapy   Jasper Riling, NP 05/14/2022, 11:38 PM

## 2022-05-15 NOTE — ED Notes (Addendum)
Pt attempted to contact family to pick him up per his request. Pt unable to reach family and is now requesting for law enforcement to take him back home. [employer] called to transport pt home.

## 2022-05-17 ENCOUNTER — Ambulatory Visit: Payer: BC Managed Care – PPO

## 2022-05-26 ENCOUNTER — Other Ambulatory Visit: Payer: Self-pay | Admitting: Medical-Surgical

## 2022-07-12 DIAGNOSIS — S91331A Puncture wound without foreign body, right foot, initial encounter: Secondary | ICD-10-CM | POA: Diagnosis not present

## 2022-07-18 ENCOUNTER — Ambulatory Visit (INDEPENDENT_AMBULATORY_CARE_PROVIDER_SITE_OTHER): Payer: Self-pay | Admitting: Medical-Surgical

## 2022-07-18 DIAGNOSIS — Z91199 Patient's noncompliance with other medical treatment and regimen due to unspecified reason: Secondary | ICD-10-CM

## 2022-07-18 DIAGNOSIS — Z113 Encounter for screening for infections with a predominantly sexual mode of transmission: Secondary | ICD-10-CM

## 2022-07-19 NOTE — Progress Notes (Signed)
   Complete physical exam  Patient: Kyle Fitzpatrick   DOB: 10/08/1999   21 y.o. Male  MRN: [id number]  Subjective:    No chief complaint on file.   Kyle Fitzpatrick is a 21 y.o. male who presents today for a complete physical exam. She reports consuming a {diet types:17450} diet. {types:19826} She generally feels {DESC; WELL/FAIRLY WELL/POORLY:18703}. She reports sleeping {DESC; WELL/FAIRLY WELL/POORLY:18703}. She {does/does not:200015} have additional problems to discuss today.    Most recent fall risk assessment:    06/15/2022   10:42 AM  Fall Risk   Falls in the past year? 0  Number falls in past yr: 0  Injury with Fall? 0  Risk for fall due to : No Fall Risks  Follow up Falls evaluation completed     Most recent depression screenings:    06/15/2022   10:42 AM 05/06/2021   10:46 AM  PHQ 2/9 Scores  PHQ - 2 Score 0 0  PHQ- 9 Score 5     {VISON DENTAL STD PSA (Optional):27386}  {History (Optional):23778}  Patient Care Team: Jerney Baksh, NP as PCP - General (Nurse Practitioner)   Outpatient Medications Prior to Visit  Medication Sig   fluticasone (FLONASE) 50 MCG/ACT nasal spray Place 2 sprays into both nostrils in the morning and at bedtime. After 7 days, reduce to once daily.   norgestimate-ethinyl estradiol (SPRINTEC 28) 0.25-35 MG-MCG tablet Take 1 tablet by mouth daily.   Nystatin POWD Apply liberally to affected area 2 times per day   spironolactone (ALDACTONE) 100 MG tablet Take 1 tablet (100 mg total) by mouth daily.   No facility-administered medications prior to visit.    ROS        Objective:     There were no vitals taken for this visit. {Vitals History (Optional):23777}  Physical Exam   No results found for any visits on 07/21/22. {Show previous labs (optional):23779}    Assessment & Plan:    Routine Health Maintenance and Physical Exam  Immunization History  Administered Date(s) Administered   DTaP 12/22/1999, 02/17/2000,  04/27/2000, 01/11/2001, 07/27/2004   Hepatitis A 05/23/2008, 05/29/2009   Hepatitis B 10/09/1999, 11/16/1999, 04/27/2000   HiB (PRP-OMP) 12/22/1999, 02/17/2000, 04/27/2000, 01/11/2001   IPV 12/22/1999, 02/17/2000, 10/16/2000, 07/27/2004   Influenza,inj,Quad PF,6+ Mos 08/29/2014   Influenza-Unspecified 11/28/2012   MMR 10/16/2001, 07/27/2004   Meningococcal Polysaccharide 05/28/2012   Pneumococcal Conjugate-13 01/11/2001   Pneumococcal-Unspecified 04/27/2000, 07/11/2000   Tdap 05/28/2012   Varicella 10/16/2000, 05/23/2008    Health Maintenance  Topic Date Due   HIV Screening  Never done   Hepatitis C Screening  Never done   INFLUENZA VACCINE  07/19/2022   PAP-Cervical Cytology Screening  07/21/2022 (Originally 10/07/2020)   PAP SMEAR-Modifier  07/21/2022 (Originally 10/07/2020)   TETANUS/TDAP  07/21/2022 (Originally 05/28/2022)   HPV VACCINES  Discontinued   COVID-19 Vaccine  Discontinued    Discussed health benefits of physical activity, and encouraged her to engage in regular exercise appropriate for her age and condition.  Problem List Items Addressed This Visit   None Visit Diagnoses     Annual physical exam    -  Primary   Cervical cancer screening       Need for Tdap vaccination          No follow-ups on file.     Jaydenn Boccio, NP   

## 2022-08-09 ENCOUNTER — Ambulatory Visit (HOSPITAL_COMMUNITY)
Admission: EM | Admit: 2022-08-09 | Discharge: 2022-08-09 | Disposition: A | Discharge status: Other Acute Inpatient Hospital | Payer: BC Managed Care – PPO | Attending: Psychiatry | Admitting: Psychiatry

## 2022-08-09 DIAGNOSIS — F331 Major depressive disorder, recurrent, moderate: Secondary | ICD-10-CM | POA: Diagnosis not present

## 2022-08-09 DIAGNOSIS — Z20822 Contact with and (suspected) exposure to covid-19: Secondary | ICD-10-CM | POA: Insufficient documentation

## 2022-08-09 DIAGNOSIS — R45851 Suicidal ideations: Secondary | ICD-10-CM | POA: Diagnosis not present

## 2022-08-09 DIAGNOSIS — F1020 Alcohol dependence, uncomplicated: Secondary | ICD-10-CM | POA: Diagnosis not present

## 2022-08-09 DIAGNOSIS — F1994 Other psychoactive substance use, unspecified with psychoactive substance-induced mood disorder: Secondary | ICD-10-CM | POA: Insufficient documentation

## 2022-08-09 DIAGNOSIS — F419 Anxiety disorder, unspecified: Secondary | ICD-10-CM | POA: Diagnosis not present

## 2022-08-09 DIAGNOSIS — F339 Major depressive disorder, recurrent, unspecified: Secondary | ICD-10-CM | POA: Diagnosis not present

## 2022-08-09 DIAGNOSIS — F32A Depression, unspecified: Secondary | ICD-10-CM | POA: Insufficient documentation

## 2022-08-09 DIAGNOSIS — I10 Essential (primary) hypertension: Secondary | ICD-10-CM | POA: Diagnosis not present

## 2022-08-09 DIAGNOSIS — E785 Hyperlipidemia, unspecified: Secondary | ICD-10-CM | POA: Diagnosis not present

## 2022-08-09 DIAGNOSIS — F17290 Nicotine dependence, other tobacco product, uncomplicated: Secondary | ICD-10-CM | POA: Diagnosis not present

## 2022-08-09 DIAGNOSIS — K219 Gastro-esophageal reflux disease without esophagitis: Secondary | ICD-10-CM | POA: Diagnosis not present

## 2022-08-09 LAB — POCT URINE DRUG SCREEN - MANUAL ENTRY (I-SCREEN)
POC Amphetamine UR: NOT DETECTED
POC Buprenorphine (BUP): NOT DETECTED
POC Cocaine UR: NOT DETECTED
POC Marijuana UR: POSITIVE — AB
POC Methadone UR: NOT DETECTED
POC Methamphetamine UR: NOT DETECTED
POC Morphine: NOT DETECTED
POC Oxazepam (BZO): NOT DETECTED
POC Oxycodone UR: NOT DETECTED
POC Secobarbital (BAR): NOT DETECTED

## 2022-08-09 LAB — LIPID PANEL
Cholesterol: 206 mg/dL — ABNORMAL HIGH (ref 0–200)
HDL: 78 mg/dL (ref >40)
LDL Cholesterol: 106 mg/dL — ABNORMAL HIGH (ref 0–99)
Total CHOL/HDL Ratio: 2.6 RATIO
Triglycerides: 109 mg/dL (ref <150)
VLDL: 22 mg/dL (ref 0–40)

## 2022-08-09 LAB — POC SARS CORONAVIRUS 2 AG -  ED: SARS Coronavirus 2 Ag: NEGATIVE

## 2022-08-09 LAB — CBC WITH DIFFERENTIAL/PLATELET
Abs Immature Granulocytes: 0.01 10*3/uL (ref 0.00–0.07)
Basophils Absolute: 0.1 10*3/uL (ref 0.0–0.1)
Basophils Relative: 1 %
Eosinophils Absolute: 0 10*3/uL (ref 0.0–0.5)
Eosinophils Relative: 1 %
HCT: 45.9 % (ref 39.0–52.0)
Hemoglobin: 16.9 g/dL (ref 13.0–17.0)
Immature Granulocytes: 0 %
Lymphocytes Relative: 36 %
Lymphs Abs: 2 10*3/uL (ref 0.7–4.0)
MCH: 34.1 pg — ABNORMAL HIGH (ref 26.0–34.0)
MCHC: 36.8 g/dL — ABNORMAL HIGH (ref 30.0–36.0)
MCV: 92.5 fL (ref 80.0–100.0)
Monocytes Absolute: 0.7 10*3/uL (ref 0.1–1.0)
Monocytes Relative: 13 %
Neutro Abs: 2.8 10*3/uL (ref 1.7–7.7)
Neutrophils Relative %: 49 %
Platelets: 369 10*3/uL (ref 150–400)
RBC: 4.96 MIL/uL (ref 4.22–5.81)
RDW: 11.9 % (ref 11.5–15.5)
WBC: 5.6 10*3/uL (ref 4.0–10.5)
nRBC: 0 % (ref 0.0–0.2)

## 2022-08-09 LAB — RESP PANEL BY RT-PCR (FLU A&B, COVID) ARPGX2
Influenza A by PCR: NEGATIVE
Influenza B by PCR: NEGATIVE
SARS Coronavirus 2 by RT PCR: NEGATIVE

## 2022-08-09 LAB — COMPREHENSIVE METABOLIC PANEL
ALT: 24 U/L (ref 0–44)
AST: 29 U/L (ref 15–41)
Albumin: 5.3 g/dL — ABNORMAL HIGH (ref 3.5–5.0)
Alkaline Phosphatase: 75 U/L (ref 38–126)
Anion gap: 13 (ref 5–15)
BUN: 7 mg/dL (ref 6–20)
CO2: 26 mmol/L (ref 22–32)
Calcium: 10 mg/dL (ref 8.9–10.3)
Chloride: 104 mmol/L (ref 98–111)
Creatinine, Ser: 1 mg/dL (ref 0.61–1.24)
GFR, Estimated: >60 mL/min (ref >60)
Glucose, Bld: 106 mg/dL — ABNORMAL HIGH (ref 70–99)
Potassium: 4.3 mmol/L (ref 3.5–5.1)
Sodium: 143 mmol/L (ref 135–145)
Total Bilirubin: 1.3 mg/dL — ABNORMAL HIGH (ref 0.3–1.2)
Total Protein: 8.2 g/dL — ABNORMAL HIGH (ref 6.5–8.1)

## 2022-08-09 LAB — HEMOGLOBIN A1C
Hgb A1c MFr Bld: 4.8 % (ref 4.8–5.6)
Mean Plasma Glucose: 91.06 mg/dL

## 2022-08-09 LAB — TSH: TSH: 1.665 u[IU]/mL (ref 0.350–4.500)

## 2022-08-09 LAB — ETHANOL: Alcohol, Ethyl (B): 108 mg/dL — ABNORMAL HIGH (ref <10)

## 2022-08-09 LAB — MAGNESIUM: Magnesium: 2.4 mg/dL (ref 1.7–2.4)

## 2022-08-09 MED ORDER — ZIPRASIDONE MESYLATE 20 MG IM SOLR: 20.0000 mg | INTRAMUSCULAR | Status: DC | PRN

## 2022-08-09 MED ORDER — THIAMINE HCL 100 MG PO TABS: 100.0000 mg | ORAL_TABLET | Freq: Every day | ORAL | Status: DC

## 2022-08-09 MED ORDER — ACETAMINOPHEN 325 MG PO TABS: 650.0000 mg | ORAL_TABLET | Freq: Four times a day (QID) | ORAL | Status: DC | PRN

## 2022-08-09 MED ORDER — MAGNESIUM HYDROXIDE 400 MG/5ML PO SUSP: 30.0000 mL | Freq: Every day | ORAL | Status: DC | PRN

## 2022-08-09 MED ORDER — LORAZEPAM 1 MG PO TABS: 1.0000 mg | ORAL_TABLET | Freq: Three times a day (TID) | ORAL | Status: DC

## 2022-08-09 MED ORDER — LORAZEPAM 1 MG PO TABS: 1.0000 mg | ORAL_TABLET | Freq: Every day | ORAL | Status: DC

## 2022-08-09 MED ORDER — HYDROXYZINE HCL 25 MG PO TABS: 25.0000 mg | ORAL_TABLET | Freq: Four times a day (QID) | ORAL | Status: DC | PRN

## 2022-08-09 MED ORDER — LOPERAMIDE HCL 2 MG PO CAPS: 2.0000 mg | ORAL_CAPSULE | ORAL | Status: DC | PRN

## 2022-08-09 MED ORDER — LORAZEPAM 1 MG PO TABS: 1.0000 mg | ORAL_TABLET | Freq: Four times a day (QID) | ORAL | Status: DC | PRN

## 2022-08-09 MED ORDER — THIAMINE HCL 100 MG/ML IJ SOLN
100.0000 mg | Freq: Once | INTRAMUSCULAR | Status: AC
  Administered 2022-08-09: 100 mg via INTRAMUSCULAR
  Filled 2022-08-09: qty 2

## 2022-08-09 MED ORDER — LORAZEPAM 1 MG PO TABS: 1.0000 mg | ORAL_TABLET | ORAL | Status: DC | PRN

## 2022-08-09 MED ORDER — ONDANSETRON 4 MG PO TBDP: 4.0000 mg | ORAL_TABLET | Freq: Four times a day (QID) | ORAL | Status: DC | PRN

## 2022-08-09 MED ORDER — ALUM & MAG HYDROXIDE-SIMETH 200-200-20 MG/5ML PO SUSP: 30.0000 mL | ORAL | Status: DC | PRN

## 2022-08-09 MED ORDER — NICOTINE 21 MG/24HR TD PT24
21.0000 mg | MEDICATED_PATCH | Freq: Every day | TRANSDERMAL | Status: DC
  Administered 2022-08-09: 21 mg via TRANSDERMAL
  Filled 2022-08-09: qty 1

## 2022-08-09 MED ORDER — TRAZODONE HCL 50 MG PO TABS: 50.0000 mg | ORAL_TABLET | Freq: Every evening | ORAL | Status: DC | PRN

## 2022-08-09 MED ORDER — LORAZEPAM 1 MG PO TABS
1.0000 mg | ORAL_TABLET | Freq: Four times a day (QID) | ORAL | Status: DC
  Administered 2022-08-09 (×2): 1 mg via ORAL
  Filled 2022-08-09 (×2): qty 1

## 2022-08-09 MED ORDER — ADULT MULTIVITAMIN W/MINERALS CH
1.0000 | ORAL_TABLET | Freq: Every day | ORAL | Status: DC
  Administered 2022-08-09: 1 via ORAL
  Filled 2022-08-09: qty 1

## 2022-08-09 MED ORDER — LORAZEPAM 1 MG PO TABS: 1.0000 mg | ORAL_TABLET | Freq: Two times a day (BID) | ORAL | Status: DC

## 2022-08-09 MED ORDER — OLANZAPINE 5 MG PO TBDP: 5.0000 mg | ORAL_TABLET | Freq: Three times a day (TID) | ORAL | Status: DC | PRN

## 2022-08-09 NOTE — BH Assessment (Addendum)
@  1020, Per Shahon, patient accepted to [facility] for admission today, pending a negative COVID. Vernard Gambles, NP, informed that patient will need a negative COVID test for his admission to [facility]. The accepting provider is Dr. Estill Cotta. Nurse report [phone]. Patient to present to the main campus upon arrival and may come anytime today. The [facility] provider Vernard Gambles, NP) and TTS staff Beryle Flock) provided disposition updates.

## 2022-08-09 NOTE — ED Notes (Signed)
Patient resting quietly in bed with eyes closed. Respirations equal and unlabored, skin warm and dry, NAD. No change in assessment or acuity. Routine safety checks conducted according to facility protocol. Will continue to monitor for safety.   

## 2022-08-09 NOTE — BH Assessment (Addendum)
Vernard Gambles, NP, recommends patient for inpatient psychiatric treatment.   The [facility] (Danika, RN) is reviewing patient for admission.    Destination Service Provider Request Status Selected Services Address Phone Fax Patient Preferred  [facility]  Pending - Request Sent N/A [address] [place] [address] [phone] --  [facility]  Pending - Request Sent N/A [address] [place], [place] [address] [phone] --  [facility]  Pending - Request Sent N/A [address], [place] [address] [phone] --  [facility]  Pending - Request Sent N/A [address] [place] [address] [phone] --  [facility]  Pending - Request Sent N/A [address] [place], [place] [address] [phone] --  [facility]  Pending - Request Sent N/A [address] [place] [address] [phone] --  [facility]  Pending - Request Sent N/A [phone] [place] [address] [phone] --  [facility]  Pending - Request Sent N/A [address] [place], [place] [address] [phone] --  [facility]  Pending - Request Sent N/A [address], [place] [address] [phone] --  [facility]  Pending - Request Sent N/A [address] [place] [address] [phone] --  [facility]  Pending - Request Sent N/A [address] [place] [address] [phone] --  [facility]  Pending - Request Sent N/A [address] [place] [address] [phone] --  [facility]  Pending - Request Sent N/A [address], [place] [address] [phone] --  [facility]   Pending - Request Sent N/A [address] [place] [address] [phone] --  [facility]  Pending - Request Sent N/A [address] [place] [address] [phone] --  [facility]  Pending - Request Sent N/A [address] [place] [address] [phone] --  [facility]  Pending - Request Sent N/A [address] [place] [address] [phone] --  [facility]  Pending - Request Sent N/A [address] [place] [address] [phone] --  [facility]  Pending - Request Sent N/A [address], [place] [address] [phone] --  [facility]  Pending - Request Sent N/A [address], [place] [address] [phone] --  [facility]  Pending - Request Sent N/A [address], [place] [address] [phone] --  [facility]  Pending - Request Sent N/A [address] [place] [address] [phone] --  [facility]  Pending - Request Sent N/A [address] [place] [address] [phone] --  [facility]  Pending - Request Sent N/A [address] [place] [address] [phone] --  [facility]  Pending - Request Sent N/A [address] [place] [address] [phone] --  [facility]  Pending - Request Sent N/A [address] [place] [address] [phone] --

## 2022-08-09 NOTE — ED Provider Notes (Signed)
Behavioral Health Urgent Care Medical Screening Exam  Patient Name: Kyle Fitzpatrick MRN: [id number] Date of Evaluation: 08/09/22 Chief Complaint:   Diagnosis:  Final diagnoses:  Substance induced mood disorder (HCC)    History of Present illness: Kyle Fitzpatrick is a 21 y.o. male [name] 22 y.o., male patient presented to [facility] as a walk in accompanied by [name] with SI.  [name], 21 y.o., male patient seen face to face by this provider, consulted with Dr. Lucianne Muss; and chart reviewed on 08/09/22.  Patient reports that he has been diagnosed with depression in the past and has taken Wellbutrin and Prozac.  He was receiving services at [facility] behavioral health in [place] but has not had an appointment or taken any medication in months.  He has no therapy services in place.  He lives in the home with his mother and father.  He works but states his hours very.  Of note patient was assessed at [facility] on 05/14/2022 with passive SI, safety planning was done at that time the patient was discharged.  Patient's sister was present during the evaluation with patient's permission.  She was quiet during the assessment and somewhat minimized patient's symptoms.  During the evaluation Kyle Fitzpatrick reports over the past few weeks he has noticed an increase in his depression and suicidal thoughts.  He identifies recent stressors as a recent break up with his girlfriend 3 days ago and his older brother passed away in late 04/17/2023 due to alcoholism.  He also reports a tumultuous relationship with his father who is an alcoholic.  Last night he drank roughly 1/5 of whiskey and began having intrusive suicidal thoughts and he retrieved a firearm that was located in the home and he was having thoughts of shooting himself.  He called 911 and was brought to [facility] for assessment.  Reports he has been drinking alcohol since he was 21 years old.  His last drink was early this a.m. before presenting to [facility].  He drinks roughly  1/5 of liquor most days. He denies other substance use. States he is not ready to be sober at this time and does not want substance treatment.  He is endorsing passive suicidal ideations and reluctantly contracts for safety.  However he has a very depressed affect and is noted to be tearful at times throughout the assessment.  However he tries to minimize his feelings and his emotions.  Discussed inpatient psychiatric mission with patient and he adamantly declined.  However due to patient's family's immediate safety concerns, patient's recent suicide attempt and high risk factors that include his age, alcoholism, recent loss of her relationship, and recent death of a family member he is at an increased risk for suicide and cannot be safely discharged at this time.  He is recommended for inpatient psychiatric admission.  He will be placed under involuntary commitment (IVC).  Collateral: Kyle Fitzpatrick (patient's sister) of note this is not the sister who was present during the evaluation.  This sister provided collateral information.  Kyle Fitzpatrick is a Designer, jewellery and states that she recognizes the symptoms of depression and patient is very depressed.  Reports he has not slept in over 3 days nor has he eaten anything other than drink alcohol.  States he talks about being suicidal constantly and makes comments such as "I just want to be dead I do not want to be here".  Reports patient is minimizing his suicidal ideations.  States during this past year patient's  depression has increased and he has attempted SI by overdose.  However he did not seek medical attention at that time.  States he has also attended a rehabilitation facility in [place] [facility] in May 2023 and he hopes to at some point to return to that facility.  Kyle Fitzpatrick states, "I know my brother and if he is discharged today he will go home and kill himself".  She does not believe patient is able to maintain his safety.  She also believes patient has been  hallucinating at times.  Reports she has noticed him responding to internal stimuli.  Of note: After patient was involuntarily committed.  Patient's sister who was present during the assessment was escorted to the lobby.  At this time she expressed her immediate safety concerns if patient was discharged.  States she was reluctant to say anything during the assessment due to the fear that she would upset her brother.  During evaluation Kyle Fitzpatrick is observed sitting in the assessment room with his sister.  He is in no acute distress.  He is alert/oriented x4 and cooperative.  He is fairly attentive and fairly groomed.  His speech is at a moderate pace and tone.  He appears anxious.  He endorses depression with feelings of hopelessness, helplessness, decreased sleep, decreased appetite, and feelings of guilt.  He has only slept 6 hours in the past 3 days.  He endorses passive SI but states he thinks he could be safe if he were to be discharged.  He denies HI/AVH.  He does not appear to be responding to internal/external stimuli. When patient was informed that he was placed under IVC, he remained calm. He was cooperative with staff with labwork, covid testing, and UDS.   Psychiatric Specialty Exam  Presentation  General Appearance:Fairly Groomed; Casual  Eye Contact:Good  Speech:Clear and Coherent; Normal Rate  Speech Volume:Normal  Handedness:Right   Mood and Affect  Mood:Anxious; Depressed; Hopeless  Affect:Tearful; Congruent; Depressed   Thought Process  Thought Processes:Coherent  Descriptions of Associations:Intact  Orientation:Full (Time, Place and Person)  Thought Content:Logical  Diagnosis of Schizophrenia or Schizoaffective disorder in past: No   Hallucinations:None  Ideas of Reference:None  Suicidal Thoughts:Yes, Passive With Intent; With Plan; With Means to Carry Out  Homicidal Thoughts:No   Sensorium  Memory:Remote Good; Immediate Fair; Recent  Fair  Judgment:Poor  Insight:Poor   Executive Functions  Concentration:Good  Attention Span:Good  Recall:Good  Fund of Knowledge:Good  Language:Good   Psychomotor Activity  Psychomotor Activity:Decreased   Assets  Assets:Communication Skills; Desire for Improvement; Physical Health; Resilience; Social Support; Housing; Financial Resources/Insurance   Sleep  Sleep:Poor  Number of hours: 0   Nutritional Assessment (For OBS and FBC admissions only) Has the patient had a weight loss or gain of 10 pounds or more in the last 3 months?: No Has the patient had a decrease in food intake/or appetite?: Yes Does the patient have dental problems?: No Does the patient have eating habits or behaviors that may be indicators of an eating disorder including binging or inducing vomiting?: No Has the patient recently lost weight without trying?: 2.0 Has the patient been eating poorly because of a decreased appetite?: 1 Malnutrition Screening Tool Score: 3    Physical Exam: Physical Exam Vitals and nursing note reviewed.  Constitutional:      General: He is not in acute distress.    Appearance: Normal appearance. He is well-developed.  HENT:     Head: Normocephalic and atraumatic.  Eyes:  General:        Right eye: No discharge.        Left eye: No discharge.     Conjunctiva/sclera: Conjunctivae normal.  Cardiovascular:     Rate and Rhythm: Normal rate.  Pulmonary:     Effort: Pulmonary effort is normal. No respiratory distress.  Musculoskeletal:        General: Normal range of motion.     Cervical back: Normal range of motion.  Skin:    General: Skin is warm.     Coloration: Skin is not jaundiced or pale.  Neurological:     Mental Status: He is alert and oriented to person, place, and time.  Psychiatric:        Attention and Perception: Perception normal.        Mood and Affect: Mood is anxious and depressed. Affect is tearful.        Speech: Speech normal.         Behavior: Behavior is cooperative.        Thought Content: Thought content includes suicidal ideation. Thought content does not include suicidal plan.        Cognition and Memory: Cognition normal.        Judgment: Judgment is impulsive.    Review of Systems  Constitutional: Negative.   HENT: Negative.    Eyes: Negative.   Respiratory: Negative.    Cardiovascular: Negative.   Musculoskeletal: Negative.   Skin: Negative.   Neurological: Negative.   Psychiatric/Behavioral:  Positive for depression, substance abuse and suicidal ideas. The patient is nervous/anxious.    Blood pressure (!) 148/87, pulse 60, temperature 98.4 F (36.9 C), temperature source Oral, resp. rate 20, SpO2 97 %. There is no height or weight on file to calculate BMI.  Musculoskeletal: Strength & Muscle Tone: within normal limits Gait & Station: normal Patient leans: N/A   BHUC MSE Discharge Disposition for Follow up and Recommendations: Based on my evaluation I certify that psychiatric inpatient services furnished can reasonably be expected to improve the patient's condition which I recommend transfer to an appropriate accepting facility.   Patient meets criteria for inpatient psychiatric admission.  He also meets the requirements for involuntary commitment.  Patient was placed under IVC.  [facility] notified and there is no bed availability.  Social work notified and patient has been accepted to [facility] pending  COVID testing and labs.  CIWA protocol initiated  Lab Orders         Resp Panel by RT-PCR (Flu A&B, Covid) Anterior Nasal Swab         CBC with Differential/Platelet         Comprehensive metabolic panel         Hemoglobin A1c         Magnesium         Ethanol         Lipid panel         TSH         RPR         POCT Urine Drug Screen - (I-Screen)         POC SARS Coronavirus 2 Ag-ED - Nasal Swab   EKG  Ardis Hughs, NP 08/09/2022, 11:23 AM

## 2022-08-09 NOTE — Discharge Instructions (Addendum)
Transfer to [facility] accepting provider

## 2022-08-09 NOTE — ED Triage Notes (Signed)
Pt presents to [facility] voluntarily, accompanied by [name] with complaint of suicidal ideation with no plan. Pt has hx of depression and anxiety and has been dealing with a recent breakup. Pt presents under the influence of alcohol and reports drinking 1/2 a fifth of liquor earlier tonight. Pt reports that he is prescribed medication (Wellbutrin) for anxiety and depression, however he has not taken medication for about three months now. Pt currently denies HI, AVH.

## 2022-08-09 NOTE — ED Notes (Signed)
Sheriff dept will be transporting pt to [facility]

## 2022-08-09 NOTE — ED Notes (Signed)
Pt A&O x 4, presents with passive suicidal ideations,  however patient reports last night after drinking he was having intrusive suicidal thoughts and retrieved a firearm that was located in the house ans he was having thoughts of shooting himself. He called 911 and was bought to [facility] for assessment..  Denies HI or AVH.  Pt calm & cooperative, monitoring for safety.
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
## 2022-08-10 LAB — GC/CHLAMYDIA PROBE AMP (~~LOC~~) NOT AT ARMC
Chlamydia: NEGATIVE
Comment: NEGATIVE
Comment: NORMAL
Neisseria Gonorrhea: NEGATIVE

## 2022-08-10 LAB — RPR: RPR Ser Ql: NONREACTIVE

## 2022-08-13 ENCOUNTER — Encounter (HOSPITAL_BASED_OUTPATIENT_CLINIC_OR_DEPARTMENT_OTHER): Payer: Self-pay

## 2022-08-13 ENCOUNTER — Other Ambulatory Visit: Payer: Self-pay

## 2022-08-13 DIAGNOSIS — Z5321 Procedure and treatment not carried out due to patient leaving prior to being seen by health care provider: Secondary | ICD-10-CM | POA: Diagnosis not present

## 2022-08-13 DIAGNOSIS — R319 Hematuria, unspecified: Secondary | ICD-10-CM | POA: Diagnosis not present

## 2022-08-13 LAB — URINALYSIS, ROUTINE W REFLEX MICROSCOPIC

## 2022-08-13 LAB — BASIC METABOLIC PANEL
Anion gap: 11 (ref 5–15)
BUN: 14 mg/dL (ref 6–20)
CO2: 23 mmol/L (ref 22–32)
Calcium: 9.8 mg/dL (ref 8.9–10.3)
Chloride: 102 mmol/L (ref 98–111)
Creatinine, Ser: 1.24 mg/dL (ref 0.61–1.24)
GFR, Estimated: >60 mL/min (ref >60)
Glucose, Bld: 108 mg/dL — ABNORMAL HIGH (ref 70–99)
Potassium: 3.6 mmol/L (ref 3.5–5.1)
Sodium: 136 mmol/L (ref 135–145)

## 2022-08-13 LAB — URINALYSIS, MICROSCOPIC (REFLEX): RBC / HPF: >50 RBC/hpf (ref 0–5)

## 2022-08-13 LAB — CBC
HCT: 42.8 % (ref 39.0–52.0)
Hemoglobin: 15.4 g/dL (ref 13.0–17.0)
MCH: 33.8 pg (ref 26.0–34.0)
MCHC: 36 g/dL (ref 30.0–36.0)
MCV: 94.1 fL (ref 80.0–100.0)
Platelets: 297 10*3/uL (ref 150–400)
RBC: 4.55 MIL/uL (ref 4.22–5.81)
RDW: 12.2 % (ref 11.5–15.5)
WBC: 18.1 10*3/uL — ABNORMAL HIGH (ref 4.0–10.5)
nRBC: 0 % (ref 0.0–0.2)

## 2022-08-13 NOTE — ED Triage Notes (Signed)
Patient here POV from Home.  Endorses Hematuria noted approximately 1 Hour ago. Reports Heavy Drinking has not been drinking for 5 days.  No N/V/D. No Fevers.   NAD Noted during Triage. A&Ox4. GCS 15. Ambulatory.

## 2022-08-14 ENCOUNTER — Emergency Department (HOSPITAL_BASED_OUTPATIENT_CLINIC_OR_DEPARTMENT_OTHER)
Admission: EM | Admit: 2022-08-14 | Discharge: 2022-08-14 | Discharge status: Against Medical Advice | Payer: BC Managed Care – PPO | Attending: Emergency Medicine | Admitting: Emergency Medicine

## 2022-08-15 ENCOUNTER — Telehealth: Payer: Self-pay | Admitting: General Practice

## 2022-08-15 NOTE — Telephone Encounter (Signed)
Transition Care Management Unsuccessful Follow-up Telephone Call  Date of discharge and from where:  08/14/22 from [facility]  Attempts:  1st Attempt  Reason for unsuccessful TCM follow-up call:  Left voice message

## 2022-08-17 NOTE — Telephone Encounter (Signed)
Transition Care Management Unsuccessful Follow-up Telephone Call  Date of discharge and from where:  08/14/22 from [facility]  Attempts:  2nd Attempt  Reason for unsuccessful TCM follow-up call:  Left voice message

## 2022-08-19 NOTE — Telephone Encounter (Signed)
Transition Care Management Unsuccessful Follow-up Telephone Call  Date of discharge and from where:  08/14/22 from [facility]  Attempts:  3rd Attempt  Reason for unsuccessful TCM follow-up call:  Left voice message

## 2022-08-23 ENCOUNTER — Ambulatory Visit (INDEPENDENT_AMBULATORY_CARE_PROVIDER_SITE_OTHER): Payer: Self-pay | Admitting: Medical-Surgical

## 2022-08-23 DIAGNOSIS — Z91199 Patient's noncompliance with other medical treatment and regimen due to unspecified reason: Secondary | ICD-10-CM

## 2022-08-23 NOTE — Progress Notes (Unsigned)
   Established Patient Office Visit  Subjective   Patient ID: Kyle Fitzpatrick, male   DOB: [birth year] Age: 21 y.o. MRN: [id number]   No chief complaint on file.  HPI Pleasant 21 year old male presenting today to discuss getting started with counseling.   Objective:    There were no vitals filed for this visit.  Physical Exam  No results found for this or any previous visit (from the past 24 hour(s)).   {Labs (Optional):23779}  The ASCVD Risk score (Arnett DK, et al., 2019) failed to calculate for the following reasons:   The 2019 ASCVD risk score is only valid for ages 55 to 25   Assessment & Plan:   No problem-specific Assessment & Plan notes found for this encounter.   No follow-ups on file.  ___________________________________________ Thayer Ohm, DNP, APRN, FNP-BC Primary Care and Sports Medicine [facility]

## 2022-08-29 ENCOUNTER — Ambulatory Visit: Payer: BC Managed Care – PPO | Admitting: Family Medicine

## 2022-08-29 ENCOUNTER — Encounter: Payer: Self-pay | Admitting: Family Medicine

## 2022-08-29 ENCOUNTER — Telehealth: Payer: Self-pay | Admitting: Medical-Surgical

## 2022-08-29 VITALS — BP 133/86 | HR 64 | Ht 69.0 in | Wt 178.0 lb

## 2022-08-29 DIAGNOSIS — F411 Generalized anxiety disorder: Secondary | ICD-10-CM

## 2022-08-29 DIAGNOSIS — F22 Delusional disorders: Secondary | ICD-10-CM | POA: Diagnosis not present

## 2022-08-29 DIAGNOSIS — F321 Major depressive disorder, single episode, moderate: Secondary | ICD-10-CM | POA: Insufficient documentation

## 2022-08-29 DIAGNOSIS — R319 Hematuria, unspecified: Secondary | ICD-10-CM | POA: Diagnosis not present

## 2022-08-29 NOTE — Assessment & Plan Note (Signed)
-   pt and sister are concerned about paranoia symptoms. Discussed that this could be part of either a diagnosis of schizophrenia or bipolar (or some other psychiatry etiology) and he needs to be properly evaluated by psychiatry and given a diagnosis so we know how to properly treat - right now I don't have a distinct diagnosis so I am unable to treat

## 2022-08-29 NOTE — Assessment & Plan Note (Signed)
-   pt has a hx of depression with suicidal thoughts but is not suicidal today nor intention. He had a plan in the past of using guns  - has good social support with sisters  - discussed plans if he were to get suicidal again and recommended going to the hospital for treatment instead of calling the police  - believe wellbutrin and buspar are not going to help when there is no distinct diagnosis at this time and I don't believe his diagnosis is just mdd - his phq 9 and gad 7 are 10/11

## 2022-08-29 NOTE — Progress Notes (Signed)
Established patient visit   Patient: Kyle Fitzpatrick   DOB: 13-Jan-2001   21 y.o. Male  MRN: [id number] Visit Date: 08/29/2022  Today's healthcare provider: Charlton Amor, DO   Chief Complaint  Patient presents with   Follow-up    SUBJECTIVE    Chief Complaint  Patient presents with   Follow-up   HPI  He has an issue with alcohol and was recently discharged from a mental hospital in [place]. He was treated for suicide, anxiety, and depression. He had suicidal thoughts and was thinking about committing for suicide. He had at that time had a plan for guns. He was discharged on naltrexone for alcoholism. He was given wellbutrin and buspar. He also has some concerns about being bipolar. Sister is present on exam and is concerned he has been hearing voices.   Sister says he was drinking and was paranoid and running in the street one time. She says he is acting not right.   Two weeks ago he had hematuria that last three hours. It has since resolved, but he is worried about this.   Review of Systems  Constitutional:  Negative for activity change, fatigue and fever.  Respiratory:  Negative for cough and shortness of breath.   Cardiovascular:  Negative for chest pain.  Gastrointestinal:  Negative for abdominal pain.  Genitourinary:  Negative for difficulty urinating.       No outpatient medications have been marked as taking for the 08/29/22 encounter (Office Visit) with Charlton Amor, DO.    OBJECTIVE    BP 133/86   Pulse 64   Ht 5\' 9"  (1.753 m)   Wt 178 lb (80.7 kg)   SpO2 98%   BMI 26.29 kg/m   Physical Exam Vitals and nursing note reviewed.  Constitutional:      General: He is not in acute distress.    Appearance: Normal appearance.  HENT:     Head: Normocephalic and atraumatic.     Right Ear: External ear normal.     Left Ear: External ear normal.     Nose: Nose normal.  Eyes:     Conjunctiva/sclera: Conjunctivae normal.  Cardiovascular:     Rate and Rhythm:  Normal rate and regular rhythm.  Pulmonary:     Effort: Pulmonary effort is normal.     Breath sounds: Normal breath sounds.  Neurological:     General: No focal deficit present.     Mental Status: He is alert and oriented to person, place, and time.  Psychiatric:        Mood and Affect: Mood normal.        Behavior: Behavior normal.        Thought Content: Thought content normal.        Judgment: Judgment normal.      MDQ: 5 yes number 1d., 1e,26f,1k,73m    ASSESSMENT & PLAN    Problem List Items Addressed This Visit       Other   MDD (major depressive disorder)    - pt has a hx of depression with suicidal thoughts but is not suicidal today nor intention. He had a plan in the past of using guns  - has good social support with sisters  - discussed plans if he were to get suicidal again and recommended going to the hospital for treatment instead of calling the police  - believe wellbutrin and buspar are not going to help when there is no distinct diagnosis at this time  and I don't believe his diagnosis is just mdd - his phq 9 and gad 7 are 10/11      Anxiety disorder - Primary   Relevant Orders   Ambulatory referral to Psychiatry   Ambulatory referral to Psychology   Depression, major, single episode, moderate (HCC)   Relevant Orders   Ambulatory referral to Psychiatry   Ambulatory referral to Psychology   Hematuria    - ordered CMP and UA to evaluate for hematuria being that is has resolved I am less concerned about something severe.       Relevant Orders   Urinalysis, Routine w reflex microscopic   COMPLETE METABOLIC PANEL WITH GFR   Paranoia (HCC)    - pt and sister are concerned about paranoia symptoms. Discussed that this could be part of either a diagnosis of schizophrenia or bipolar (or some other psychiatry etiology) and he needs to be properly evaluated by psychiatry and given a diagnosis so we know how to properly treat - right now I don't have a distinct  diagnosis so I am unable to treat         Return in about 2 months (around 10/29/2022).      No orders of the defined types were placed in this encounter.   Orders Placed This Encounter  Procedures   Urinalysis, Routine w reflex microscopic   COMPLETE METABOLIC PANEL WITH GFR   Ambulatory referral to Psychiatry    Referral Priority:   Routine    Referral Type:   Psychiatric    Referral Reason:   Specialty Services Required    Requested Specialty:   Psychiatry    Number of Visits Requested:   1   Ambulatory referral to Psychology    Referral Priority:   Routine    Referral Type:   Psychiatric    Referral Reason:   Specialty Services Required    Requested Specialty:   Psychology    Number of Visits Requested:   1     Charlton Amor, DO  [facility] [phone] (phone) [phone] (fax)  [facility]

## 2022-08-29 NOTE — Telephone Encounter (Signed)
Patient is wanting to switch care over to Dr. Tamera Punt. Per patient he connects better with Dr. Tamera Punt. Please advise if this will be okay. Patient would like next appt to be with Dr. Tamera Punt.

## 2022-08-29 NOTE — Assessment & Plan Note (Signed)
-   ordered CMP and UA to evaluate for hematuria being that is has resolved I am less concerned about something severe.

## 2022-08-29 NOTE — Telephone Encounter (Signed)
That's fine with me. He is scheduled for a physical soon so he will have to have that switched over.

## 2022-08-30 LAB — COMPLETE METABOLIC PANEL WITH GFR
AG Ratio: 1.9 (calc) (ref 1.0–2.5)
ALT: 18 U/L (ref 9–46)
AST: 24 U/L (ref 10–40)
Albumin: 5.6 g/dL — ABNORMAL HIGH (ref 3.6–5.1)
Alkaline phosphatase (APISO): 67 U/L (ref 36–130)
BUN: 9 mg/dL (ref 7–25)
CO2: 25 mmol/L (ref 20–32)
Calcium: 10.3 mg/dL (ref 8.6–10.3)
Chloride: 103 mmol/L (ref 98–110)
Creat: 0.93 mg/dL (ref 0.60–1.24)
Globulin: 2.9 g/dL (calc) (ref 1.9–3.7)
Glucose, Bld: 94 mg/dL (ref 65–99)
Potassium: 4.7 mmol/L (ref 3.5–5.3)
Sodium: 143 mmol/L (ref 135–146)
Total Bilirubin: 0.4 mg/dL (ref 0.2–1.2)
Total Protein: 8.5 g/dL — ABNORMAL HIGH (ref 6.1–8.1)
eGFR: 120 mL/min/{1.73_m2} (ref >=60)

## 2022-08-30 LAB — URINALYSIS, ROUTINE W REFLEX MICROSCOPIC
Bilirubin Urine: NEGATIVE
Glucose, UA: NEGATIVE
Hgb urine dipstick: NEGATIVE
Ketones, ur: NEGATIVE
Leukocytes,Ua: NEGATIVE
Nitrite: NEGATIVE
Protein, ur: NEGATIVE
Specific Gravity, Urine: 1.014 (ref 1.001–1.035)
pH: 6.5 (ref 5.0–8.0)

## 2022-08-30 NOTE — Telephone Encounter (Signed)
Spoke with patient and he is aware he is able to switch his care to Dr. Tamera Punt. Per patient he will call back to schedule his appointment.

## 2022-09-02 ENCOUNTER — Encounter: Payer: BC Managed Care – PPO | Admitting: Medical-Surgical

## 2022-09-13 ENCOUNTER — Emergency Department (HOSPITAL_COMMUNITY)
Admission: EM | Admit: 2022-09-13 | Discharge: 2022-09-14 | Disposition: A | Discharge status: Home / Self Care | Payer: BC Managed Care – PPO | Attending: Emergency Medicine | Admitting: Emergency Medicine

## 2022-09-13 ENCOUNTER — Emergency Department (HOSPITAL_COMMUNITY): Payer: BC Managed Care – PPO

## 2022-09-13 DIAGNOSIS — R519 Headache, unspecified: Secondary | ICD-10-CM | POA: Insufficient documentation

## 2022-09-13 DIAGNOSIS — F10129 Alcohol abuse with intoxication, unspecified: Secondary | ICD-10-CM | POA: Insufficient documentation

## 2022-09-13 DIAGNOSIS — I10 Essential (primary) hypertension: Secondary | ICD-10-CM | POA: Diagnosis not present

## 2022-09-13 DIAGNOSIS — I959 Hypotension, unspecified: Secondary | ICD-10-CM | POA: Diagnosis not present

## 2022-09-13 DIAGNOSIS — R4781 Slurred speech: Secondary | ICD-10-CM | POA: Diagnosis not present

## 2022-09-13 DIAGNOSIS — G4489 Other headache syndrome: Secondary | ICD-10-CM | POA: Diagnosis not present

## 2022-09-13 DIAGNOSIS — R404 Transient alteration of awareness: Secondary | ICD-10-CM | POA: Diagnosis not present

## 2022-09-13 DIAGNOSIS — R4182 Altered mental status, unspecified: Secondary | ICD-10-CM | POA: Diagnosis not present

## 2022-09-13 LAB — CBC
HCT: 47.3 % (ref 39.0–52.0)
Hemoglobin: 17 g/dL (ref 13.0–17.0)
MCH: 34.8 pg — ABNORMAL HIGH (ref 26.0–34.0)
MCHC: 35.9 g/dL (ref 30.0–36.0)
MCV: 96.9 fL (ref 80.0–100.0)
Platelets: 279 10*3/uL (ref 150–400)
RBC: 4.88 MIL/uL (ref 4.22–5.81)
RDW: 12.8 % (ref 11.5–15.5)
WBC: 5.5 10*3/uL (ref 4.0–10.5)
nRBC: 0 % (ref 0.0–0.2)

## 2022-09-13 LAB — COMPREHENSIVE METABOLIC PANEL
ALT: 44 U/L (ref 0–44)
AST: 49 U/L — ABNORMAL HIGH (ref 15–41)
Albumin: 4.8 g/dL (ref 3.5–5.0)
Alkaline Phosphatase: 60 U/L (ref 38–126)
Anion gap: 12 (ref 5–15)
BUN: 7 mg/dL (ref 6–20)
CO2: 25 mmol/L (ref 22–32)
Calcium: 9.3 mg/dL (ref 8.9–10.3)
Chloride: 106 mmol/L (ref 98–111)
Creatinine, Ser: 0.9 mg/dL (ref 0.61–1.24)
GFR, Estimated: >60 mL/min (ref >60)
Glucose, Bld: 102 mg/dL — ABNORMAL HIGH (ref 70–99)
Potassium: 3.3 mmol/L — ABNORMAL LOW (ref 3.5–5.1)
Sodium: 143 mmol/L (ref 135–145)
Total Bilirubin: 0.5 mg/dL (ref 0.3–1.2)
Total Protein: 7.7 g/dL (ref 6.5–8.1)

## 2022-09-13 LAB — I-STAT CHEM 8, ED
BUN: 5 mg/dL — ABNORMAL LOW (ref 6–20)
Calcium, Ion: 1.05 mmol/L — ABNORMAL LOW (ref 1.15–1.40)
Chloride: 105 mmol/L (ref 98–111)
Creatinine, Ser: 1.3 mg/dL — ABNORMAL HIGH (ref 0.61–1.24)
Glucose, Bld: 105 mg/dL — ABNORMAL HIGH (ref 70–99)
HCT: 50 % (ref 39.0–52.0)
Hemoglobin: 17 g/dL (ref 13.0–17.0)
Potassium: 3 mmol/L — ABNORMAL LOW (ref 3.5–5.1)
Sodium: 145 mmol/L (ref 135–145)
TCO2: 26 mmol/L (ref 22–32)

## 2022-09-13 LAB — URINALYSIS, ROUTINE W REFLEX MICROSCOPIC
Bilirubin Urine: NEGATIVE
Glucose, UA: NEGATIVE mg/dL
Ketones, ur: NEGATIVE mg/dL
Leukocytes,Ua: NEGATIVE
Nitrite: NEGATIVE
Protein, ur: NEGATIVE mg/dL
Specific Gravity, Urine: 1.01 (ref 1.005–1.030)
pH: 6.5 (ref 5.0–8.0)

## 2022-09-13 LAB — CBG MONITORING, ED: Glucose-Capillary: 106 mg/dL — ABNORMAL HIGH (ref 70–99)

## 2022-09-13 LAB — ETHANOL: Alcohol, Ethyl (B): 384 mg/dL (ref <10)

## 2022-09-13 LAB — RAPID URINE DRUG SCREEN, HOSP PERFORMED
Amphetamines: NOT DETECTED
Barbiturates: NOT DETECTED
Benzodiazepines: NOT DETECTED
Cocaine: NOT DETECTED
Opiates: NOT DETECTED
Tetrahydrocannabinol: POSITIVE — AB

## 2022-09-13 LAB — TROPONIN I (HIGH SENSITIVITY): Troponin I (High Sensitivity): 3 ng/L (ref <18)

## 2022-09-13 LAB — URINALYSIS, MICROSCOPIC (REFLEX): Squamous Epithelial / HPF: NONE SEEN (ref 0–5)

## 2022-09-13 LAB — LACTIC ACID, PLASMA: Lactic Acid, Venous: 1.8 mmol/L (ref 0.5–1.9)

## 2022-09-13 LAB — PROTIME-INR
INR: 1 (ref 0.8–1.2)
Prothrombin Time: 12.9 seconds (ref 11.4–15.2)

## 2022-09-13 MED ORDER — LACTATED RINGERS IV BOLUS
1000.0000 mL | Freq: Once | INTRAVENOUS | Status: AC
  Administered 2022-09-13: 1000 mL via INTRAVENOUS

## 2022-09-13 MED ORDER — IOHEXOL 350 MG/ML SOLN
75.0000 mL | Freq: Once | INTRAVENOUS | Status: AC | PRN
  Administered 2022-09-13: 75 mL via INTRAVENOUS

## 2022-09-13 NOTE — ED Notes (Signed)
Sister at bedside at this time. Sister reports suicidal ideation, schizoaffective disorder, alcohol abuse, flank pain x3 weeks, and report of chest pain tonight.

## 2022-09-13 NOTE — ED Provider Notes (Signed)
[facility] EMERGENCY DEPARTMENT Provider Note   CSN: [id number] Arrival date & time: 09/13/22  2048     History  Chief Complaint  Patient presents with   Fall   Altered Mental Status    Wynton Hufstetler is a 21 y.o. male.  HPI 21 year old male presents after a fall/head injury.  History is quite limited as EMS had to give him IM Versed for agitation and interfering with medical treatment.  History is obtained from EMS who is at the bedside as well as his sister who showed up shortly after I saw him.  Patient has a chronic history of alcohol abuse and schizophrenia.  He is chronically suicidal according to her.  He is also been dealing with on and off chest pain as well as about 2 weeks of flank pain.  He today he had been drinking and tripped and fell and struck his head.  He then was having a hard time sitting up and he was stating that his flank was hurting him.  He was intoxicated and EMS chose to give IM Versed.  He did start dropping his O2 sats after this.  Currently, patient is spontaneously moving but unresponsive and is unable to provide any significant history.  Home Medications Prior to Admission medications   Not on File      Allergies    Bactrim [sulfamethoxazole-trimethoprim]    Review of Systems   Review of Systems  Unable to perform ROS: Mental status change    Physical Exam Updated Vital Signs BP 123/66   Pulse 84   Temp 98.2 F (36.8 C) (Temporal)   Resp 20   SpO2 96%  Physical Exam Vitals and nursing note reviewed.  Constitutional:      Appearance: He is well-developed.     Interventions: Cervical collar in place.  HENT:     Head: Normocephalic and atraumatic.     Comments: No obvious scalp injury Eyes:     Pupils: Pupils are equal, round, and reactive to light.     Comments: Diffuse conjunctival injection  Cardiovascular:     Rate and Rhythm: Normal rate and regular rhythm.     Heart sounds: Normal heart sounds.  Pulmonary:      Effort: Pulmonary effort is normal.     Breath sounds: Normal breath sounds.  Abdominal:     General: There is no distension.     Palpations: Abdomen is soft.     Tenderness: There is no abdominal tenderness.  Skin:    General: Skin is warm and dry.  Neurological:     Mental Status: He is lethargic.     Comments: Patient is unresponsive. He responds to painful stimuli and now is starting to spontaneously move arms/legs.      ED Results / Procedures / Treatments   Labs (all labs ordered are listed, but only abnormal results are displayed) Labs Reviewed  COMPREHENSIVE METABOLIC PANEL - Abnormal; Notable for the following components:      Result Value   Potassium 3.3 (*)    Glucose, Bld 102 (*)    AST 49 (*)    All other components within normal limits  CBC - Abnormal; Notable for the following components:   MCH 34.8 (*)    All other components within normal limits  ETHANOL - Abnormal; Notable for the following components:   Alcohol, Ethyl (B) 384 (*)    All other components within normal limits  URINALYSIS, ROUTINE W REFLEX MICROSCOPIC - Abnormal; Notable  for the following components:   Hgb urine dipstick TRACE (*)    All other components within normal limits  RAPID URINE DRUG SCREEN, HOSP PERFORMED - Abnormal; Notable for the following components:   Tetrahydrocannabinol POSITIVE (*)    All other components within normal limits  URINALYSIS, MICROSCOPIC (REFLEX) - Abnormal; Notable for the following components:   Bacteria, UA RARE (*)    All other components within normal limits  CBG MONITORING, ED - Abnormal; Notable for the following components:   Glucose-Capillary 106 (*)    All other components within normal limits  I-STAT CHEM 8, ED - Abnormal; Notable for the following components:   Potassium 3.0 (*)    BUN 5 (*)    Creatinine, Ser 1.30 (*)    Glucose, Bld 105 (*)    Calcium, Ion 1.05 (*)    All other components within normal limits  LACTIC ACID, PLASMA   PROTIME-INR  SAMPLE TO BLOOD BANK  TROPONIN I (HIGH SENSITIVITY)    EKG EKG Interpretation  Date/Time:  Tuesday September 13 2022 21:09:45 EDT Ventricular Rate:  75 PR Interval:  190 QRS Duration: 114 QT Interval:  412 QTC Calculation: 461 R Axis:   75 Text Interpretation: Sinus rhythm Borderline intraventricular conduction delay Confirmed by Zadie Rhine ([id number]) on 09/13/2022 11:18:56 PM  Radiology CT CHEST ABDOMEN PELVIS W CONTRAST  Result Date: 09/13/2022 CLINICAL DATA:  Trauma.  Fall. EXAM: CT CHEST, ABDOMEN, AND PELVIS WITH CONTRAST TECHNIQUE: Multidetector CT imaging of the chest, abdomen and pelvis was performed following the standard protocol during bolus administration of intravenous contrast. RADIATION DOSE REDUCTION: This exam was performed according to the departmental dose-optimization program which includes automated exposure control, adjustment of the mA and/or kV according to patient size and/or use of iterative reconstruction technique. CONTRAST:  27mL OMNIPAQUE IOHEXOL 350 MG/ML SOLN COMPARISON:  CT abdomen and pelvis 05/17/2021 FINDINGS: CT CHEST FINDINGS Cardiovascular: No significant vascular findings. Normal heart size. No pericardial effusion. Mediastinum/Nodes: No enlarged mediastinal, hilar, or axillary lymph nodes. Thyroid gland, trachea, and esophagus demonstrate no significant findings. Lungs/Pleura: Lungs are clear. No pleural effusion or pneumothorax. Musculoskeletal: No chest wall mass or suspicious bone lesions identified. CT ABDOMEN PELVIS FINDINGS Hepatobiliary: No hepatic injury or perihepatic hematoma. Gallbladder is unremarkable. Pancreas: Unremarkable. No pancreatic ductal dilatation or surrounding inflammatory changes. Spleen: No splenic injury or perisplenic hematoma. Adrenals/Urinary Tract: No adrenal hemorrhage or renal injury identified. Bladder is unremarkable. Stomach/Bowel: Stomach is within normal limits. Appendix appears normal. No evidence of  bowel wall thickening, distention, or inflammatory changes. Vascular/Lymphatic: No significant vascular findings are present. No enlarged abdominal or pelvic lymph nodes. Reproductive: Prostate is unremarkable. Other: No abdominal wall hernia or abnormality. No abdominopelvic ascites. Musculoskeletal: No acute or significant osseous findings. IMPRESSION: 1. No acute process in the chest, abdomen or pelvis. Electronically Signed   By: Darliss Cheney M.D.   On: 09/13/2022 22:32   CT HEAD WO CONTRAST  Result Date: 09/13/2022 CLINICAL DATA:  Trauma.  Fall. EXAM: CT HEAD WITHOUT CONTRAST CT CERVICAL SPINE WITHOUT CONTRAST TECHNIQUE: Multidetector CT imaging of the head and cervical spine was performed following the standard protocol without intravenous contrast. Multiplanar CT image reconstructions of the cervical spine were also generated. RADIATION DOSE REDUCTION: This exam was performed according to the departmental dose-optimization program which includes automated exposure control, adjustment of the mA and/or kV according to patient size and/or use of iterative reconstruction technique. COMPARISON:  CT head 04/14/2017.  CT cervical spine 04/10/2017. FINDINGS: CT HEAD FINDINGS  Brain: No evidence of acute infarction, hemorrhage, hydrocephalus, extra-axial collection or mass lesion/mass effect. Vascular: No hyperdense vessel or unexpected calcification. Skull: Normal. Negative for fracture or focal lesion. Sinuses/Orbits: No acute finding. Other: None. CT CERVICAL SPINE FINDINGS Alignment: Normal. Skull base and vertebrae: No acute fracture. No primary bone lesion or focal pathologic process. Soft tissues and spinal canal: No prevertebral fluid or swelling. No visible canal hematoma. Disc levels: No significant central canal or neural foraminal stenosis at any level. Upper chest: Negative. Other: None. IMPRESSION: No acute intracranial process. No acute fracture or traumatic subluxation of the cervical spine.  Electronically Signed   By: Ronney Asters M.D.   On: 09/13/2022 21:50   CT CERVICAL SPINE WO CONTRAST  Result Date: 09/13/2022 CLINICAL DATA:  Trauma.  Fall. EXAM: CT HEAD WITHOUT CONTRAST CT CERVICAL SPINE WITHOUT CONTRAST TECHNIQUE: Multidetector CT imaging of the head and cervical spine was performed following the standard protocol without intravenous contrast. Multiplanar CT image reconstructions of the cervical spine were also generated. RADIATION DOSE REDUCTION: This exam was performed according to the departmental dose-optimization program which includes automated exposure control, adjustment of the mA and/or kV according to patient size and/or use of iterative reconstruction technique. COMPARISON:  CT head 04/14/2017.  CT cervical spine 04/10/2017. FINDINGS: CT HEAD FINDINGS Brain: No evidence of acute infarction, hemorrhage, hydrocephalus, extra-axial collection or mass lesion/mass effect. Vascular: No hyperdense vessel or unexpected calcification. Skull: Normal. Negative for fracture or focal lesion. Sinuses/Orbits: No acute finding. Other: None. CT CERVICAL SPINE FINDINGS Alignment: Normal. Skull base and vertebrae: No acute fracture. No primary bone lesion or focal pathologic process. Soft tissues and spinal canal: No prevertebral fluid or swelling. No visible canal hematoma. Disc levels: No significant central canal or neural foraminal stenosis at any level. Upper chest: Negative. Other: None. IMPRESSION: No acute intracranial process. No acute fracture or traumatic subluxation of the cervical spine. Electronically Signed   By: Ronney Asters M.D.   On: 09/13/2022 21:50   DG Pelvis Portable  Result Date: 09/13/2022 CLINICAL DATA:  Trauma. EXAM: PORTABLE PELVIS 1-2 VIEWS COMPARISON:  Pelvic radiograph dated 09/25/2018 and CT abdomen pelvis dated 05/17/2021. FINDINGS: There is no evidence of pelvic fracture or diastasis. No pelvic bone lesions are seen. IMPRESSION: Negative. Electronically Signed    By: Anner Crete M.D.   On: 09/13/2022 21:20   DG Chest Port 1 View  Result Date: 09/13/2022 CLINICAL DATA:  Trauma EXAM: PORTABLE CHEST 1 VIEW COMPARISON:  Chest x-ray 02/11/2020 FINDINGS: The heart size and mediastinal contours are within normal limits. Both lungs are clear. The visualized skeletal structures are unremarkable. IMPRESSION: No active disease. Electronically Signed   By: Ronney Asters M.D.   On: 09/13/2022 21:20    Procedures Procedures    Medications Ordered in ED Medications  lactated ringers bolus 1,000 mL (1,000 mLs Intravenous New Bag/Given 09/13/22 2116)  iohexol (OMNIPAQUE) 350 MG/ML injection 75 mL (75 mLs Intravenous Contrast Given 09/13/22 2127)    ED Course/ Medical Decision Making/ A&P Clinical Course as of 09/13/22 2339  Tue Sep 13, 2022  2115 Patient was upgraded to a level 2 trauma.  While he is quite lethargic, he seems to be protecting his airway and a lot of this seems to be alcohol and Versed related.  I do not think he needs emergent intubation.  I suspect most of his mental status is from alcohol/drugs but will definitely need a head CT.  Based on other things from the sister including the  flank pain and chest pain, full trauma scans will be obtained given the limited exam [SG]    Clinical Course User Index [SG] Pricilla Loveless, MD                           Medical Decision Making Amount and/or Complexity of Data Reviewed Labs: ordered.    Details: EtOH very elevated.  Mild hypokalemia.  Normal troponin. Radiology: ordered.    Details: CT head and C-spine without head bleed or fracture.  No acute intra-abdominal trauma ECG/medicine tests: independent interpretation performed.    Details: No significant arrhythmia  Risk Prescription drug management.   Patient is quite intoxicated and on top of this he was given Versed.  He is now seeming to sleep off his intoxication.  Is protecting his airway on multiple rechecks.  I have discussed with  sister, who is concerned about his overall psychiatric condition though it seems like he has suicidal thoughts.  He will need reeval when he wakes up.  No significant trauma noted.  Care transferred to Dr. Bebe Shaggy.        Final Clinical Impression(s) / ED Diagnoses Final diagnoses:  Alcohol abuse with intoxication [facility])    Rx / DC Orders ED Discharge Orders     None         Pricilla Loveless, MD 09/13/22 2341

## 2022-09-13 NOTE — Progress Notes (Signed)
   09/13/22 2110  Clinical Encounter Type  Visited With Patient;Family  Visit Type Trauma;Initial  Referral From Nurse  Consult/Referral To Chaplain  Stress Factors  Family Stress Factors Family relationships;Exhausted   Chaplain Jorene Guest responded to the page. The patient was taken to CT; the patient's sister, Lonn Georgia was at his bed. I actively listened as she tearful spoke of the patient's alcohol usage and suicide ideation. She said the patient depends on her for support because both parents are unable to care for him due to their alcohol dependency. Lonn Georgia said the patient was admitted to a rehab in [place] for a month. He start drinking again after breakup with his girlfriend and their older brother died last year. She said the patient is having a hard time coping. I offered words of encouragement and introduced chaplain services. The patient returned;however, he was a sleep. Advised her chaplain remains available for follow up spiritual/emotional support as needed. This note was prepared by Jeanine Luz, M.Div..  For questions please contact by phone [phone].

## 2022-09-13 NOTE — ED Triage Notes (Addendum)
Pt arrived via [facility] for AMS, fall from home. Family called EMS to report that pt tripped and fell to concrete, pt reports that he struck the back of his head. Heavy alcohol use reported by and family, daily alcohol use. Initially GCS 14, A&Ox4, repeated questioning, but able to answer basic orientation questions correctly. EMS reports that patient would intermittently agree to transport, then become combative and noncompliant. Pt ultimately agreed to transport, but became violently agitated and combative during transport, threatening medical staff. 5mg  IM Versed administered left deltoid. Pt responsive to pain upon arrival to ED, GCS 8. Pt placed on 4 L [place]. 20g LAC.   PTA EMS Vitals BP 112/62 HR 80 RR 16-22 SPO2 96% 4L [place] CBG 114

## 2022-09-13 NOTE — ED Notes (Signed)
Pt log rolled with Tommas Olp MD

## 2022-09-13 NOTE — ED Notes (Signed)
[place] placed

## 2022-09-13 NOTE — ED Notes (Signed)
Patient transported to CT with TRN Nira Conn

## 2022-09-14 NOTE — Discharge Instructions (Signed)
Substance Abuse Treatment Programs ° °Intensive Outpatient Programs °[facility]     °[address]      °[place], [place]                   °[phone]      ° °[facility] °[address] °[place], [place] °336-379-7146 ° °[facility]     °(Inpatient and outpatient)     °[address]           °336-832-9800   ° °[facility] °336-288-1484 (Suboxone and Methadone) ° °[address]      °[place], [place] [address]      °336-882-2125      ° °[address] °[place], [place] °852-3033 ° °[facility] (Outpatient/Inpatient, Chemical)    °(insurance only) [phone]      °       °[facility] (Groups & Residential) °[place], [place] °336-389-1413 ° °   °[facility]     °[address]     °[place], [place]      °336-389-1413      ° °[facility] (for caregivers and family) °[address] °[place], [place] °336-299-4655 ° ° ° ° ° °Residential Treatment Programs °[facility]      °[address], [place], [place] [address]  °([phone]      ° °[facility] °[address] [place], [place] [address] °919-419-1059 ° °[facility]        °336-248-8914      ° °[facility] °1-800-659-3381 ° °[facility]             °[address]                                         °[place], [place]                                                °877-615-2722 or 336-784-9470                              ° °[facility] [place] °[address] °[place], [address] °1.877.941.8954 ° °[facility]    °[address]      °[place], [place]     °336-273-5306      ° °[facility] °[address] °[place], [place] °336-285-9073 ° °[facility]   °[address]     °[place], [place] [address]     °336-899-1550      °Admissions: 8am-3pm M-F ° °[facility] °[address] °[place],  [place] °336-227-7417 ° °[facility]: Residential Program (90 Days)   °[place], [place]      °336-725-8389 or 800-758-6077    ° °[facility] °[place], [place] °(Walk in Hours over the weekend or by referral) ° °[facility] °[address], [place], [place] [address] °(336) 723-1848 ° °Crisis Mobile: [facility]:  1-877-626-1772 (for crisis response 24 hours a day) °[facility] Hotline:      1-800-256-2452 °Outpatient Psychiatry and Counseling ° °[facility]: Mobile Crisis   Management 24 hours:  1-877-626-1772 ° °[facility] sliding scale fee and walk in schedule: M-F 8am-12pm/1pm-3pm °[address]  °[place], [place] [address] °336-387-6161 ° °[facility] °[address] °[place], [place] [address] °336-703-9650 ° °[facility] ([facility])- new patient walk-in appointments available Monday - Friday 8am -3pm.          °[address] °[place], [place] [address] °336-676-6840 or crisis line- 336-676-6905 ° °[facility]/ Intensive Outpatient Therapy Program °[address] °[place], [place] [address] °336-832-9804 ° °[facility]                  °Crisis Services      °336.641.4993      °[address]     °[place], [place] [address]                ° °[facility]   °[facility] °800.525.9375 °[address] °[place], [place] [address] ° ° °[facility]          °[address],  °[place], [place] [address]       °(336) [phone] ° °[facility] °[address] °[place], [place] [address] °336-292-1510 ° °[facility]    °[address]    °[place], [place] [address]     °336-632-3505      ° °Parish McKinney, MD     °[address]     °[place] [address]     °336-282-1251     °  °[facility] °[address] °[place] [address] ° °[facility]     °[address]     °[place], [place]      °336-542-2076      ° °[facility] °Shamsher Ahluwalia, MD °[address] °[place], [place] [address] °336-420-9558 ° °[facility]     °[address]     °[place], [place] [address]     °336-274-1237      ° °[facility] °[address] °[place], [place] [address] °336-854-4450 °Resources for Temporary Residential Assistance/Crisis Centers ° °DAY CENTERS °[facility] °M-F 8am-3pm   °[address] [place], [place] [address]   336-332-0824 °Services include: laundry, barbering, support groups, case management, phone  & computer access, showers, AA/NA mtgs, mental health/substance abuse nurse, job skills class, disability information, VA assistance, spiritual classes, etc.  ° °HOMELESS SHELTERS ° °[facility]     °[facility]   °[address], [place]     °336.271.5959       °       °[facility] (women and children)       °[address] °[place], [place] [address] °336-275-0820 °[email or web address] for application and process °Application Required ° °[facility]   °[address]    °[place] [address]     °336.886.4922       °             °[facility] °[address] °, [place] [address] °336.273.5572 °336-235-0363(schedule application appt.) °Application Required ° °[facility] (women only)    °[address]     °[place], [place] [address]     °336-884-1039      °  Intake starts 6pm daily °Need valid ID, SSC, & Police report °[facility] [place] °[address] °[place], [place] °336-881-5420 °Application Required ° °[facility] (men only)     °[address]      °[place], [place]     °336.748.1962      ° °[facility] °(Pregnant women only) °[address] °[place], [place] °336-275-0206 ° °[facility]      °[address]      °[place], [place] [address]     °336-722-9951      °       °[facility] [facility] °[address] °[place], [place] °336-723-1848 °90 day commitment/SA/Application process ° °[facility](men only)     °[address]     °[place], [place]     °336-748-1962       °Check-in at 7pm     °       °[facility] °[address] °[place], [place] [address] °336-248-6684 °Men/Women/Women and Children must be there by 7 pm ° °[facility] °[place],  °336-722-8721                ° °

## 2022-09-14 NOTE — ED Provider Notes (Signed)
I assumed care in signout to follow-up on testing to await his sobriety. Patient is awake and alert in no acute distress.  He is drinking fluids.  He is requesting discharge.  He is not interested in seeking care for his alcohol use disorder.  He reports he does have arrangements for rehab in [place] later this week.  Patient be discharged home with his sister   Ripley Fraise, MD 09/14/22 [phone]

## 2022-09-14 NOTE — ED Notes (Signed)
Ccollar removed. Pt more awake at this time. Calm and cooperative, alert to voice. Mildly confused to events/situation.

## 2022-09-14 NOTE — ED Notes (Signed)
Trauma Response Nurse Documentation   Kyle Fitzpatrick is a 21 y.o. male arriving to [facility] ED via EMS  Trauma was activated as a Level 2 based on the following trauma criteria GCS 10-14 associated with trauma or AVPU < A. Trauma team at the bedside on patient arrival.   Pt transported to CT with trauma response nurse present to monitor. RN remained with the patient throughout their absence from the department for clinical observation.    History   Past Medical History:  Diagnosis Date   Allergy    Asthma    no hospitalizations; no ED visits.     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 The patient was reported to be combative with EMS which required Versed 5mg  administration. Patient responsive to pain, believed to be altered due to medication administration as well as intoxication. Patient transported to CT.  Bedside handoff with ED RN [place].    Dorene Ar  Trauma Response RN  Please call TRN at [phone] for further assistance.

## 2022-09-14 NOTE — ED Notes (Signed)
Pt's sister stated pt was asking for ice water and pants. This tech went into the room and told him that the only pants we had were the blue disposable scrubs and asked what size he would need. He stated "those pants weren't good enough and to just forget about it". I did give the pt some ice water and he wanted me to the ask the nurse for pain nurse. RN notified of pt's request.

## 2023-03-05 DIAGNOSIS — R1011 Right upper quadrant pain: Secondary | ICD-10-CM | POA: Diagnosis not present

## 2023-03-05 DIAGNOSIS — R042 Hemoptysis: Secondary | ICD-10-CM | POA: Diagnosis not present

## 2023-03-05 DIAGNOSIS — F1010 Alcohol abuse, uncomplicated: Secondary | ICD-10-CM | POA: Diagnosis not present

## 2023-03-05 DIAGNOSIS — Z79899 Other long term (current) drug therapy: Secondary | ICD-10-CM | POA: Diagnosis not present

## 2023-03-05 DIAGNOSIS — F10929 Alcohol use, unspecified with intoxication, unspecified: Secondary | ICD-10-CM | POA: Diagnosis not present

## 2023-03-05 DIAGNOSIS — F17210 Nicotine dependence, cigarettes, uncomplicated: Secondary | ICD-10-CM | POA: Diagnosis not present

## 2023-03-05 DIAGNOSIS — R079 Chest pain, unspecified: Secondary | ICD-10-CM | POA: Diagnosis not present

## 2023-03-05 DIAGNOSIS — R1032 Left lower quadrant pain: Secondary | ICD-10-CM | POA: Diagnosis not present

## 2023-03-05 DIAGNOSIS — R1084 Generalized abdominal pain: Secondary | ICD-10-CM | POA: Diagnosis not present

## 2023-03-05 DIAGNOSIS — R109 Unspecified abdominal pain: Secondary | ICD-10-CM | POA: Diagnosis not present

## 2023-03-05 DIAGNOSIS — Z8719 Personal history of other diseases of the digestive system: Secondary | ICD-10-CM | POA: Diagnosis not present

## 2023-03-06 ENCOUNTER — Telehealth: Payer: Self-pay | Admitting: General Practice

## 2023-03-06 NOTE — Transitions of Care (Post Inpatient/ED Visit) (Signed)
   03/06/2023  Name: Kyle Fitzpatrick MRN: [id number] DOB: 2000-12-30  Today's TOC FU Call Status: Today's TOC FU Call Status:: Unsuccessul Call (1st Attempt) Unsuccessful Call (1st Attempt) Date: 03/06/23  Attempted to reach the patient regarding the most recent Inpatient/ED visit.  Follow Up Plan: Additional outreach attempts will be made to reach the patient to complete the Transitions of Care (Post Inpatient/ED visit) call.   Signature Tinnie Gens, RN BSN

## 2023-03-09 NOTE — Transitions of Care (Post Inpatient/ED Visit) (Signed)
   03/09/2023  Name: Kyle Fitzpatrick MRN: [id number] DOB: 2001/03/28  Today's TOC FU Call Status: Today's TOC FU Call Status:: Unsuccessful Call (2nd Attempt) Unsuccessful Call (1st Attempt) Date: 03/06/23 Unsuccessful Call (2nd Attempt) Date: 03/09/23  Attempted to reach the patient regarding the most recent Inpatient/ED visit.  Follow Up Plan: Additional outreach attempts will be made to reach the patient to complete the Transitions of Care (Post Inpatient/ED visit) call.   Signature Tinnie Gens, RN BSN

## 2023-03-10 NOTE — Transitions of Care (Post Inpatient/ED Visit) (Signed)
   03/10/2023  Name: Kyle Fitzpatrick MRN: [id number] DOB: 02-20-2001  Today's TOC FU Call Status: Today's TOC FU Call Status:: Unsuccessful Call (3rd Attempt) Unsuccessful Call (1st Attempt) Date: 03/06/23 Unsuccessful Call (2nd Attempt) Date: 03/09/23 Unsuccessful Call (3rd Attempt) Date: 03/10/23  Attempted to reach the patient regarding the most recent Inpatient/ED visit.  Follow Up Plan: No further outreach attempts will be made at this time. We have been unable to contact the patient.  Signature Tinnie Gens, RN BSN

## 2024-04-25 ENCOUNTER — Encounter: Payer: Self-pay | Admitting: Family Medicine

## 2024-10-13 NOTE — ED Provider Notes (Signed)
 EMERGENCY DEPARTMENT HISTORY AND PHYSICAL EXAM   Patient Name: Kyle Fitzpatrick  Date of Birth: November 28, 2001 Medical Record Number: [id number]           History of Presenting Illness   History: Kyle Fitzpatrick is a 23 y.o. male who presents with a complaint of a pilonidal cyst that was evaluated at urgent care, where drainage was recommended but not performed. The patient reports localized soreness at the site but denies fever, body aches, or chills. There is a history of a similar cyst approximately five years ago, which required incision and drainage at that time.  Patient does work outside and recently was on a long car trip which he believes exacerbated his symptoms. States he first noticed the area of irritation on Wednesday.  Unless otherwise mentioned, symptoms are generalized, non radiating, acute and mild in nature.  History obtained by patient.   Per Triage note: Patient presents to ED for evaluation of pilonidal cyst that began having pain Wednesday. States this happened in the past, 5 years ago   +Redness to tailbone area   PMHx; HTN    Primary care provider: NONE, VERIFIED  Specialist(s):              Past History  Past Medical History:  Past Medical History:  Diagnosis Date  . Essential hypertension    No other reported past medical history  Past Surgical History:   No past surgical history on file. No other reported past surgical history  Family History:  No family history on file. No other reported past family history  Social History:  Social History   Tobacco Use  . Smoking status: Never  . Smokeless tobacco: Never  Vaping Use  . Vaping status: Never Used   Social History   Substance and Sexual Activity  Drug Use Not on file   Patient denies other significant social history  Allergies:  Allergies  Allergen Reactions  . Sulfamethoxazole-Trimethoprim Hives and Rash    Home Medications: Current Outpatient Medications on File Prior to  Encounter  Medication Last Dose/Taking  . amLODIPine (NORVASC) 10 MG tablet Taking  . fluvoxaMINE (LUVOX) 50 MG tablet Taking               Review of Systems  SEE HPI for ROS                Physical Exam  BP 139/79   Pulse 90   Temp 36.7 C (98 F) (Oral)   Resp 20   Ht 175.3 cm (5' 9)   Wt 104.3 kg (230 lb)   SpO2 98%   BMI 33.97 kg/m   Physical Exam Constitutional:      Appearance: Normal appearance.  HENT:     Head: Normocephalic and atraumatic.  Eyes:     Extraocular Movements: Extraocular movements intact.     Conjunctiva/sclera: Conjunctivae normal.     Pupils: Pupils are equal, round, and reactive to light.  Cardiovascular:     Rate and Rhythm: Normal rate and regular rhythm.  Pulmonary:     Effort: Pulmonary effort is normal.     Breath sounds: Normal breath sounds.  Musculoskeletal:        General: Normal range of motion.  Skin:    General: Skin is warm and dry.     Capillary Refill: Capillary refill takes less than 2 seconds.     Findings: Abscess present.     Comments: Pilonidal cyst with redness, tenderness  Neurological:  General: No focal deficit present.     Mental Status: He is alert and oriented to person, place, and time. Mental status is at baseline.  Psychiatric:        Mood and Affect: Mood normal.        Behavior: Behavior normal.                          Diagnostic Study Results   I reviewed all labs and interpreted the labs myself, including, but not limited to the ones listed below;  Labs -  Labs Reviewed - No data to display   Radiologic Studies - No orders to display                    Medical Decision Making  I am the first provider for this patient.   I reviewed the vital signs, available nursing notes, past medical history, past surgical history, family history and social history.   Vital Signs-Reviewed the patient's vital signs.  BP 139/79   Pulse 90   Temp 36.7 C (98 F) (Oral)   Resp 20   Ht 175.3 cm  (5' 9)   Wt 104.3 kg (230 lb)   SpO2 98%   BMI 33.97 kg/m    Old Medical Records: Recent medical records available were reviewed. Previous electrocardiograms available were reviewed.Nursing notes available were reviewed. Previous labs available were reviewed.   EKG:  Interpreted by me  ED Course:    MDM:  Kyle Fitzpatrick is a 23 y.o. male who presents with localized soreness at the site of a pilonidal cyst, previously evaluated at urgent care where drainage was recommended but not performed. Will perform I/D here in ER.  Patient states he is up-to-date on his tetanus and had one last year.  Denies any body aches, fever or chills.  I utilized an evidence-based risk rating tool (CMT) along with my training and experience to weigh the risk of discharge against the risks of further testing, imaging, or hospitalization. At this time I estimate the risks of additional testing, imaging, or hospitalization to be equal to or greater than the risk of discharge. I discussed my risk assessment with the patient and the patient consents to the risk of discharge as well as the risk of uncertainty in estimating outcomes.  At this time, the risk of sepsis or NSTI is very low and most likely lower than the risk of additional testing/imaging or hospitalization. [facility]   This patient presents with initial presentation of local erythema, warmth, swelling concerning for cellulitis. Sensitivity/pain to light touch around the erythematous area. No lymphangitic spread visible and no fluid pockets or fluctuance c/f abscess noted. Low c/f osteomyelitis or DVT. No immune compromise, bullae, pain out of proportion, or rapid progression c/f necrotizing fasciitis.   Considered admission, not at this time. I/D performed, see procedure note for details.   Procedures: Incision and Drainage  Date/Time: 10/14/2024 10:25 AM  Performed by: Pleasant Krone, CRNP Authorized by: Pleasant Krone, CRNP   Consent:     Consent obtained:  Verbal   Consent given by:  Patient   Risks, benefits, and alternatives were discussed: yes     Risks discussed:  Bleeding, incomplete drainage, pain, infection and damage to other organs   Alternatives discussed:  No treatment Universal protocol:    Procedure explained and questions answered to patient or proxy's satisfaction: yes     Site/side marked: yes  Immediately prior to procedure, a time out was called: yes     Patient identity confirmed:  Verbally with patient and arm band Location:    Type:  Pilonidal cyst   Size:  2.5   Location: Pilonidal cyst. Pre-procedure details:    Skin preparation:  Povidone-iodine Sedation:    Sedation type:  None Anesthesia:    Anesthesia method:  Local infiltration   Local anesthetic:  Lidocaine  1% w/o epi Procedure type:    Complexity:  Complex Procedure details:    Ultrasound guidance: no     Needle aspiration: no     Incision types:  Single straight   Wound management:  Irrigated with saline   Drainage:  Bloody and purulent   Drainage amount:  Moderate   Packing materials:  1/4 in iodoform gauze   Amount 1/4 iodoform:  4 Post-procedure details:    Procedure completion:  Tolerated  Will start on doxycycline  as patient is allergic to Bactrim.  Will DC home in stable condition at this time with instructions to return to this ED in 2 days for packing removal and wound reevaluation.  Instructed to utilize warm compresses to the area and keep dressing clean and dry.   Critical Care Time:  0  Consults involved: None         Disposition and Diagnosis  Clinical Impression:  1. Pilonidal cyst   2. Abscess   3. Encounter for incision and drainage procedure    Disposition: Discharge Condition at time of disposition: Stable, good, and improved   Medication List    START taking these medications   . doxycycline  100 MG capsule; Commonly known as: VIBRAMYCIN ; Take 1  capsule by mouth 2 times daily for 10  days.   ASK your doctor about these medications   . amLODIPine 10 MG tablet; Commonly known as: NORVASC . fluvoxaMINE 50 MG tablet; Commonly known as: LUVOX    I am to follow up as follows: (Call to confirm and/or arrange today.)     [facility] EMERGENCY DEPARTMENT Follow up in 2 day(s).   Specialty: Emergency Why: For wound re-check and packing removal Contact information [address] [place]  [address] [phone]                ED Vitals from 10/13/24 0526 to 10/13/24 1726    Date/Time BP Pulse Monitor Resp Sp02 Temp Patient Position Who  10/13/24 1607 139/79 90 -- 20 98 36.7 (98) -- ET      Medications  lidocaine  (XYLOCAINE ) 1 % injection 15 mL (15 mLs Other - OR/Procedural Use Given by Other 10/13/24 1716)  doxycycline  (MONODOX ) capsule 100 mg (100 mg Oral Given 10/13/24 1721)      _______________________________     *This note was generated using voice recognition software which may contain certain grammatical and word choice errors.DEWAINE Pleasant Krone, CRNP 10/14/24 1029    Curtistine Starleen Joshua PONCE, MD 10/17/24 1530   ------------------------------------------------------------------------------- Attestation signed by Curtistine Starleen Joshua PONCE, MD at 10/17/24 1530 I was available for consult in the ED. Chart was reviewed and signed after patient was seen, evaluated, and discharged. This is part of my administrative duties by working as a development worker, community in this ED.   Curtistine Starleen Joshua PONCE, MD -------------------------------------------------------------------------------

## 2024-10-17 ENCOUNTER — Emergency Department (HOSPITAL_BASED_OUTPATIENT_CLINIC_OR_DEPARTMENT_OTHER)
Admission: EM | Admit: 2024-10-17 | Discharge: 2024-10-17 | Disposition: A | Discharge status: Home / Self Care | Attending: Emergency Medicine | Admitting: Emergency Medicine

## 2024-10-17 ENCOUNTER — Other Ambulatory Visit: Payer: Self-pay

## 2024-10-17 ENCOUNTER — Encounter (HOSPITAL_BASED_OUTPATIENT_CLINIC_OR_DEPARTMENT_OTHER): Payer: Self-pay

## 2024-10-17 DIAGNOSIS — Z5189 Encounter for other specified aftercare: Secondary | ICD-10-CM

## 2024-10-17 DIAGNOSIS — Z4800 Encounter for change or removal of nonsurgical wound dressing: Secondary | ICD-10-CM | POA: Insufficient documentation

## 2024-10-17 NOTE — ED Triage Notes (Signed)
 Pt report pilonidal cyst that was drained x2 days ago. Pt reports increased pain. Pt reports starting Doxy this AM.

## 2024-10-17 NOTE — ED Provider Notes (Signed)
 [place] EMERGENCY DEPARTMENT AT [facility] Provider Note   CSN: [id number] Arrival date & time: 10/17/24  8091     Patient presents with: Abscess   Kyle Fitzpatrick is a 23 y.o. male.   The history is provided by the patient and medical records. No language interpreter was used.  Wound Check This is a new problem. The current episode started more than 2 days ago. The problem has not changed since onset.Pertinent negatives include no chest pain, no abdominal pain, no headaches and no shortness of breath. Nothing aggravates the symptoms. Nothing relieves the symptoms. He has tried nothing for the symptoms. The treatment provided no relief.       Prior to Admission medications   Not on File    Allergies: Bactrim [sulfamethoxazole-trimethoprim]    Review of Systems  Constitutional:  Negative for chills, fatigue and fever.  HENT:  Negative for congestion.   Respiratory:  Negative for cough, chest tightness and shortness of breath.   Cardiovascular:  Negative for chest pain and palpitations.  Gastrointestinal:  Negative for abdominal pain, constipation, diarrhea, nausea and vomiting.  Genitourinary:  Negative for dysuria and flank pain.  Musculoskeletal:  Negative for back pain, neck pain and neck stiffness.  Skin:  Positive for rash and wound.  Neurological:  Negative for light-headedness and headaches.  Psychiatric/Behavioral:  Negative for agitation.   All other systems reviewed and are negative.   Updated Vital Signs BP 129/76 (BP Location: Right Arm)   Pulse (!) 115   Temp 98.4 F (36.9 C) (Oral)   Resp 17   Ht 5' 9 (1.753 m)   Wt 104.3 kg   SpO2 95%   BMI 33.97 kg/m   Physical Exam Vitals and nursing note reviewed.  Constitutional:      General: He is not in acute distress.    Appearance: He is well-developed. He is not ill-appearing, toxic-appearing or diaphoretic.  HENT:     Head: Normocephalic and atraumatic.  Eyes:     Conjunctiva/sclera:  Conjunctivae normal.  Cardiovascular:     Rate and Rhythm: Normal rate and regular rhythm.     Heart sounds: No murmur heard. Pulmonary:     Effort: Pulmonary effort is normal. No respiratory distress.     Breath sounds: Normal breath sounds.  Abdominal:     Tenderness: There is no right CVA tenderness or left CVA tenderness.  Musculoskeletal:        General: Tenderness present. No swelling.     Cervical back: Neck supple.     Thoracic back: No tenderness.     Lumbar back: No tenderness.     Comments: Patient has erythema at the top of his gluteal fold near where he had a recent pilonidal cyst drainage.  There is no fluctuance or crepitance just some diffuse erythema.  No palpable pocket needing drainage at this time.  Patient's drainage wound is healing with no purulence and there is no packing visible or present.  No other back tenderness.  Skin:    General: Skin is warm and dry.     Capillary Refill: Capillary refill takes less than 2 seconds.     Findings: Erythema present.  Neurological:     Mental Status: He is alert.  Psychiatric:        Mood and Affect: Mood normal.     (all labs ordered are listed, but only abnormal results are displayed) Labs Reviewed - No data to display  EKG: None  Radiology: No results found.  Procedures   Medications Ordered in the ED - No data to display                                  Medical Decision Making   Kyle Fitzpatrick is a 23 y.o. male with a past medical history significant for asthma, GERD, dyslipidemia, and asthma who recently had a pilonidal cyst that was drained 4 days ago in [place]  and he presents for wound evaluation.  Patient says that he was given prescription for doxycycline  but did not fill it till today and is only taken 1 dose of antibiotics.  He reports that he could not feel the packing and he was worried it was stuck inside him or there was a problem with it.  He reports he is having some persistent pain but it  is not as bad as before.  He does not feel any new pockets to drain and otherwise is denying systemic symptoms.  He denies fevers, chills, congestion, cough, nausea, vomiting, constipation, diarrhea, or urinary changes.  Denies any spreading of the pain or rash.  He wants to make sure his wound is well-appearing and does not need further management.  On my exam with a chaperone, patient does have some erythema around the site of his pilonidal drainage.  There is no purulence no crepitance and no fluctuance specifically.  There is some surrounding erythema with mild tenderness.  No other back tenderness CVA tenderness or upper back tenderness.  Lungs clear.  Patient otherwise well-appearing.  We had a shared decision making conversation about a plan and agreed to hold on extensive workup.  Given his lack of systemic symptoms or worsening symptoms we agreed to hold on labs or imaging.  As we do not feel a pocket of abscess, patient agrees to hold on repeat incision and drainage.  He thinks he just needs to take his antibiotics and I agree.  Patient will continue his doxycycline  and his over-the-counter pain medicine.  We discussed giving him other oral pain medicine prescription but he did not want it.  He has not yet failed antibiotics but I do suspect some cellulitis.  We discussed that if symptoms were to worsen and he was to feel a bulge again he would likely need a redrainage attempt but we did not see that was needed now and he agrees.  We will give him instructions to follow-up with general surgery for further evaluation if it becomes a problem again.  He understand return precaution and follow-up instructions and was discharged in good condition with well-appearing wound with some cellulitis that he has antibiotics to take at home.           Final diagnoses:  Visit for wound check    ED Discharge Orders     None      Clinical Impression: 1. Visit for wound check     Disposition:  Discharge  Condition: Good  I have discussed the results, Dx and Tx plan with the pt(& family if present). He/she/they expressed understanding and agree(s) with the plan. Discharge instructions discussed at great length. Strict return precautions discussed and pt &/or family have verbalized understanding of the instructions. No further questions at time of discharge.    New Prescriptions   No medications on file    Follow Up: Bevin Bernice GORMAN ROSALEA [address], [address] [place] [address] [phone]     Kyle Fitzpatrick  Health Emergency Department at [facility] [address] [place]  [id number] [phone]    Surgery, [facility] [address] [place] [address] [place] [address] [phone]   for general surgery follow up       Kalimah Capurro, Lonni PARAS, MD 10/17/24 2013

## 2024-10-17 NOTE — ED Notes (Signed)
 DC paperwork given and verbally understood.

## 2024-10-17 NOTE — Discharge Instructions (Signed)
 Your history, exam, and evaluation today seem consistent with an already drained pilonidal cyst abscess that has some residual cellulitis and tenderness.  There was no evidence of retained foreign body or packing and on exam he did not have further abscess to drain today.  Please continue taking the antibiotics and over-the-counter pain medicine.  Please rest and stay hydrated and follow-up with your.  Please consider follow-up with general surgery if it continues to be a problem.  If any symptoms change or worsen acutely, return to the nearest emergency department.

## 2024-11-26 ENCOUNTER — Emergency Department (HOSPITAL_COMMUNITY)
Admission: EM | Admit: 2024-11-26 | Discharge: 2024-11-26 | Disposition: A | Discharge status: Home / Self Care | Attending: Emergency Medicine | Admitting: Emergency Medicine

## 2024-11-26 ENCOUNTER — Other Ambulatory Visit: Payer: Self-pay

## 2024-11-26 DIAGNOSIS — F1010 Alcohol abuse, uncomplicated: Secondary | ICD-10-CM

## 2024-11-26 LAB — COMPREHENSIVE METABOLIC PANEL WITH GFR
ALT: 194 U/L — ABNORMAL HIGH (ref 0–44)
AST: 188 U/L — ABNORMAL HIGH (ref 15–41)
Albumin: 4.7 g/dL (ref 3.5–5.0)
Alkaline Phosphatase: 94 U/L (ref 38–126)
Anion gap: 18 — ABNORMAL HIGH (ref 5–15)
BUN: <5 mg/dL — ABNORMAL LOW (ref 6–20)
CO2: 29 mmol/L (ref 22–32)
Calcium: 9.4 mg/dL (ref 8.9–10.3)
Chloride: 93 mmol/L — ABNORMAL LOW (ref 98–111)
Creatinine, Ser: 0.79 mg/dL (ref 0.61–1.24)
GFR, Estimated: >60 mL/min (ref >60)
Glucose, Bld: 123 mg/dL — ABNORMAL HIGH (ref 70–99)
Potassium: 2.8 mmol/L — ABNORMAL LOW (ref 3.5–5.1)
Sodium: 140 mmol/L (ref 135–145)
Total Bilirubin: 0.6 mg/dL (ref 0.0–1.2)
Total Protein: 7.7 g/dL (ref 6.5–8.1)

## 2024-11-26 LAB — CBC WITH DIFFERENTIAL/PLATELET
Abs Immature Granulocytes: 0.02 K/uL (ref 0.00–0.07)
Basophils Absolute: 0 K/uL (ref 0.0–0.1)
Basophils Relative: 1 %
Eosinophils Absolute: 0 K/uL (ref 0.0–0.5)
Eosinophils Relative: 1 %
HCT: 45 % (ref 39.0–52.0)
Hemoglobin: 16.6 g/dL (ref 13.0–17.0)
Immature Granulocytes: 1 %
Lymphocytes Relative: 51 %
Lymphs Abs: 2 K/uL (ref 0.7–4.0)
MCH: 37.1 pg — ABNORMAL HIGH (ref 26.0–34.0)
MCHC: 36.9 g/dL — ABNORMAL HIGH (ref 30.0–36.0)
MCV: 100.7 fL — ABNORMAL HIGH (ref 80.0–100.0)
Monocytes Absolute: 0.4 K/uL (ref 0.1–1.0)
Monocytes Relative: 10 %
Neutro Abs: 1.4 K/uL — ABNORMAL LOW (ref 1.7–7.7)
Neutrophils Relative %: 36 %
Platelets: 381 K/uL (ref 150–400)
RBC: 4.47 MIL/uL (ref 4.22–5.81)
RDW: 13.5 % (ref 11.5–15.5)
WBC: 3.9 K/uL — ABNORMAL LOW (ref 4.0–10.5)
nRBC: 0 % (ref 0.0–0.2)

## 2024-11-26 LAB — URINE DRUG SCREEN
Amphetamines: NEGATIVE
Barbiturates: NEGATIVE
Benzodiazepines: NEGATIVE
Cocaine: NEGATIVE
Fentanyl: NEGATIVE
Methadone Scn, Ur: NEGATIVE
Opiates: NEGATIVE
Tetrahydrocannabinol: POSITIVE — AB

## 2024-11-26 LAB — ETHANOL: Alcohol, Ethyl (B): 205 mg/dL — ABNORMAL HIGH (ref <15)

## 2024-11-26 MED ORDER — POTASSIUM CHLORIDE CRYS ER 20 MEQ PO TBCR: 40.0000 meq | EXTENDED_RELEASE_TABLET | Freq: Once | ORAL | Status: DC

## 2024-11-26 MED ORDER — POTASSIUM CHLORIDE CRYS ER 20 MEQ PO TBCR
60.0000 meq | EXTENDED_RELEASE_TABLET | Freq: Once | ORAL | Status: AC
  Administered 2024-11-26: 60 meq via ORAL
  Filled 2024-11-26: qty 3

## 2024-11-26 MED ORDER — POTASSIUM CHLORIDE CRYS ER 20 MEQ PO TBCR: 20.0000 meq | EXTENDED_RELEASE_TABLET | Freq: Once | ORAL | 0 refills | Status: AC

## 2024-11-26 NOTE — ED Provider Notes (Signed)
 [place] EMERGENCY DEPARTMENT AT [facility] Provider Note   CSN: [id number] Arrival date & time: 11/26/24  1722     Patient presents with: Alcohol Intoxication   Kyle Fitzpatrick is a 23 y.o. male.  23 year old male presents ED for medical clearance to be admitted into [facility] for alcohol detox.  Patient reports for the last 3 months he has drinking on average fifth a day.  He reports the last time he drank was approximately 4 hours ago which was approximately a pint of alcohol.  Patient endorses mild.  He has never had a seizure from alcohol withdrawal and he has successfully detox from alcohol once prior.  Patient reports he was sleeping earlier and had a very vivid dream but he does not think it was an actual hallucination.  Patient reports he is ready to get out of here and continue the facility.  Patient denies any pain, headache, vomiting, tremors.     Prior to Admission medications   Medication Sig Start Date End Date Taking? Authorizing Provider  potassium chloride  SA (KLOR-CON  M) 20 MEQ tablet Take 1 tablet (20 mEq total) by mouth once for 1 dose. 11/26/24 11/26/24 Yes Myriam Fonda RAMAN, PA-C    Allergies: Bactrim [sulfamethoxazole-trimethoprim]    Review of Systems  Gastrointestinal:  Positive for nausea.  All other systems reviewed and are negative.   Updated Vital Signs BP (!) 154/97   Pulse 100   Temp 98.6 F (37 C) (Oral)   Resp 16   SpO2 95%   Physical Exam Vitals and nursing note reviewed.  Constitutional:      Appearance: Normal appearance.  HENT:     Head: Normocephalic and atraumatic.     Nose: Nose normal.  Eyes:     Extraocular Movements: Extraocular movements intact.     Conjunctiva/sclera: Conjunctivae normal.     Pupils: Pupils are equal, round, and reactive to light.  Cardiovascular:     Rate and Rhythm: Normal rate.     Heart sounds: Normal heart sounds.  Pulmonary:     Effort: Pulmonary effort is normal. No respiratory  distress.     Breath sounds: Normal breath sounds.  Abdominal:     General: Abdomen is flat.     Palpations: Abdomen is soft.     Tenderness: There is no abdominal tenderness. There is no guarding.  Musculoskeletal:        General: Normal range of motion.     Cervical back: Normal range of motion.  Skin:    General: Skin is warm.     Capillary Refill: Capillary refill takes less than 2 seconds.  Neurological:     General: No focal deficit present.     Mental Status: He is alert.     Cranial Nerves: No cranial nerve deficit.     Comments: No tremors noted on exam  Psychiatric:        Mood and Affect: Mood normal.        Behavior: Behavior normal.     (all labs ordered are listed, but only abnormal results are displayed) Labs Reviewed  COMPREHENSIVE METABOLIC PANEL WITH GFR - Abnormal; Notable for the following components:      Result Value   Potassium 2.8 (*)    Chloride 93 (*)    Glucose, Bld 123 (*)    BUN <5 (*)    AST 188 (*)    ALT 194 (*)    Anion gap 18 (*)    All other  components within normal limits  CBC WITH DIFFERENTIAL/PLATELET - Abnormal; Notable for the following components:   WBC 3.9 (*)    MCV 100.7 (*)    MCH 37.1 (*)    MCHC 36.9 (*)    Neutro Abs 1.4 (*)    All other components within normal limits  ETHANOL - Abnormal; Notable for the following components:   Alcohol, Ethyl (B) 205 (*)    All other components within normal limits  URINE DRUG SCREEN - Abnormal; Notable for the following components:   Tetrahydrocannabinol POSITIVE (*)    All other components within normal limits    EKG: None  Radiology: No results found.   Procedures   Medications Ordered in the ED  potassium chloride  SA (KLOR-CON  M) CR tablet 60 mEq (60 mEq Oral Given 11/26/24 2019)    23 y.o. male presents to the ED for medical clearance to go to [facility].  On arrival pt is nontoxic, vitals unremarkable. Exam is unremarkable for any significant  findings.  I ordered medication potassium for hypokalemia  Lab Tests:  I Ordered, reviewed, and interpreted labs, which included: CMP, ethanol, UDS, CBC.  CMP remarkable for hypokalemia and elevated AST ALT with admitted alcohol abuse.  CBC unremarkable.  Ethanol markable for elevation at 205.  UDS significant for positive THC.  ED Course:   Patient is sitting comfortably in ED bed in no acute distress nontoxic-appearing.  Patient denies any complaint and denies any hallucinations reports last drink was approximately 3 hours ago.  Patient reports he just needs medical clearance to go to [facility]. CIWA score of 1.  Patient reported he did have some nausea before but does not have any nausea or vomiting now.   Patient was placed on 60 mEq of potassium in the ED and prescribed 20 mill equivalents for 1 week and advised to reevaluate potassium at that time.  Patient was cleared for discharge at this time.  Patient advised facility will come pick him up.  He was advised it is imperative that he not drive with alcohol in his system and he called facility at that time for transport.  Patient was discharged at this time.  Portions of this note were generated with Scientist, clinical (histocompatibility and immunogenetics). Dictation errors may occur despite best attempts at proofreading.   Final diagnoses:  ETOH abuse    ED Discharge Orders          Ordered    potassium chloride  SA (KLOR-CON  M) 20 MEQ tablet   Once        11/26/24 2018               Myriam Fonda GORMAN DEVONNA 11/26/24 2234    Dasie Faden, MD 12/01/24 1739

## 2024-11-26 NOTE — Discharge Instructions (Addendum)
 Lab work was reassuring today.  Your potassium was low and we have started you on potassium here.  I have sent in potassium and would like you to take it once daily for 7 days.  Please have potassium reevaluated in approximately 7 days.  If you experience any new or concerning symptoms please return to the ED for further evaluation.

## 2024-11-26 NOTE — ED Triage Notes (Signed)
 Pt arrives via EMS from [facility]. Pt reports that he consumes a fifth of whiskey daily. Pt reports that he drank today get rid of a hallucination. [facility]219   [facility] is [facility] to [facility] after being cleared.   EMS VS P-106 BP-146/90 O2- 96% RA   PT denies any complaints. Calm, cooperative, and does not appear intoxicated.
